# Patient Record
Sex: Male | Born: 1945 | Race: White | Hispanic: No | Marital: Married | Smoking: Never smoker
Health system: Southern US, Community
[De-identification: ages and names within clinical notes are randomized; demographics above are authoritative.]

## PROBLEM LIST (undated history)

## (undated) DIAGNOSIS — M109 Gout, unspecified: Secondary | ICD-10-CM

## (undated) DIAGNOSIS — E079 Disorder of thyroid, unspecified: Secondary | ICD-10-CM

## (undated) DIAGNOSIS — I10 Essential (primary) hypertension: Secondary | ICD-10-CM

## (undated) DIAGNOSIS — E785 Hyperlipidemia, unspecified: Secondary | ICD-10-CM

## (undated) HISTORY — PX: TONSILLECTOMY: SUR1361

## (undated) HISTORY — PX: CARDIAC SURGERY: SHX584

## (undated) HISTORY — DX: Disorder of thyroid, unspecified: E07.9

## (undated) HISTORY — PX: OTHER SURGICAL HISTORY: SHX169

## (undated) HISTORY — DX: Essential (primary) hypertension: I10

## (undated) HISTORY — DX: Hyperlipidemia, unspecified: E78.5

## (undated) HISTORY — DX: Gout, unspecified: M10.9

## (undated) HISTORY — PX: APPENDECTOMY: SHX54

---

## 2007-02-20 LAB — HM COLONOSCOPY

## 2012-01-25 ENCOUNTER — Inpatient Hospital Stay: Payer: Self-pay | Admitting: Family Medicine

## 2012-01-25 LAB — COMPREHENSIVE METABOLIC PANEL
Albumin: 4.3 g/dL (ref 3.4–5.0)
Alkaline Phosphatase: 81 U/L (ref 50–136)
BUN: 25 mg/dL — ABNORMAL HIGH (ref 7–18)
Bilirubin,Total: 1.3 mg/dL — ABNORMAL HIGH (ref 0.2–1.0)
Calcium, Total: 8.7 mg/dL (ref 8.5–10.1)
Co2: 19 mmol/L — ABNORMAL LOW (ref 21–32)
Creatinine: 2.15 mg/dL — ABNORMAL HIGH (ref 0.60–1.30)
EGFR (African American): 35 — ABNORMAL LOW
Glucose: 119 mg/dL — ABNORMAL HIGH (ref 65–99)
Osmolality: 277 (ref 275–301)
Sodium: 136 mmol/L (ref 136–145)
Total Protein: 7.7 g/dL (ref 6.4–8.2)

## 2012-01-25 LAB — URINALYSIS, COMPLETE
Bilirubin,UR: NEGATIVE
Blood: NEGATIVE
Glucose,UR: NEGATIVE mg/dL (ref 0–75)
Hyaline Cast: 3
Ketone: NEGATIVE
Leukocyte Esterase: NEGATIVE
Nitrite: NEGATIVE
Ph: 6 (ref 4.5–8.0)
Specific Gravity: 1.008 (ref 1.003–1.030)
WBC UR: 1 /HPF (ref 0–5)

## 2012-01-25 LAB — MAGNESIUM: Magnesium: 1.6 mg/dL — ABNORMAL LOW

## 2012-01-25 LAB — CBC WITH DIFFERENTIAL/PLATELET
Eosinophil %: 2.1 %
HCT: 32.7 % — ABNORMAL LOW (ref 40.0–52.0)
Lymphocyte #: 1.7 10*3/uL (ref 1.0–3.6)
MCH: 20 pg — ABNORMAL LOW (ref 26.0–34.0)
MCV: 65 fL — ABNORMAL LOW (ref 80–100)
Monocyte #: 1.2 x10 3/mm — ABNORMAL HIGH (ref 0.2–1.0)
Monocyte %: 11.4 %
Neutrophil #: 7.4 10*3/uL — ABNORMAL HIGH (ref 1.4–6.5)
Platelet: 379 10*3/uL (ref 150–440)
RBC: 5.04 10*6/uL (ref 4.40–5.90)
WBC: 10.7 10*3/uL — ABNORMAL HIGH (ref 3.8–10.6)

## 2012-01-25 LAB — CK TOTAL AND CKMB (NOT AT ARMC): CK, Total: 180 U/L (ref 35–232)

## 2012-01-25 LAB — IRON AND TIBC
Iron Bind.Cap.(Total): 503 ug/dL — ABNORMAL HIGH (ref 250–450)
Iron Saturation: 7 %
Iron: 33 ug/dL — ABNORMAL LOW (ref 65–175)
Unbound Iron-Bind.Cap.: 470 ug/dL

## 2012-01-25 LAB — PROTIME-INR
INR: 0.9
Prothrombin Time: 12.8 secs (ref 11.5–14.7)

## 2012-01-25 LAB — PRO B NATRIURETIC PEPTIDE: B-Type Natriuretic Peptide: 1001 pg/mL — ABNORMAL HIGH (ref 0–125)

## 2012-01-25 LAB — TSH: Thyroid Stimulating Horm: 7.63 u[IU]/mL — ABNORMAL HIGH

## 2012-01-25 LAB — T4, FREE: Free Thyroxine: 1.17 ng/dL (ref 0.76–1.46)

## 2012-01-26 LAB — LIPID PANEL
Cholesterol: 85 mg/dL (ref 0–200)
HDL Cholesterol: 21 mg/dL — ABNORMAL LOW (ref 40–60)
Ldl Cholesterol, Calc: 36 mg/dL (ref 0–100)
VLDL Cholesterol, Calc: 28 mg/dL (ref 5–40)

## 2012-01-26 LAB — TROPONIN I: Troponin-I: 0.66 ng/mL — ABNORMAL HIGH

## 2012-01-26 LAB — BASIC METABOLIC PANEL
BUN: 25 mg/dL — ABNORMAL HIGH (ref 7–18)
Chloride: 108 mmol/L — ABNORMAL HIGH (ref 98–107)
Co2: 23 mmol/L (ref 21–32)
Creatinine: 1.84 mg/dL — ABNORMAL HIGH (ref 0.60–1.30)
Sodium: 138 mmol/L (ref 136–145)

## 2012-01-26 LAB — CBC WITH DIFFERENTIAL/PLATELET
Basophil #: 0 x10 3/mm 3
Basophil %: 0.7 %
Eosinophil #: 0.3 x10 3/mm 3
Eosinophil %: 5 %
HCT: 27.3 % — ABNORMAL LOW
HGB: 8.4 g/dL — ABNORMAL LOW
Lymphocyte %: 18.3 %
Lymphs Abs: 1.1 x10 3/mm 3
MCH: 20.4 pg — ABNORMAL LOW
MCHC: 30.8 g/dL — ABNORMAL LOW
MCV: 66 fL — ABNORMAL LOW
Monocyte #: 0.8 "x10 3/mm "
Monocyte %: 12.6 %
Neutrophil #: 3.9 x10 3/mm 3
Neutrophil %: 63.4 %
Platelet: 238 x10 3/mm 3
RBC: 4.13 x10 6/mm 3 — ABNORMAL LOW
RDW: 19.2 % — ABNORMAL HIGH
WBC: 6.2 x10 3/mm 3

## 2012-01-26 LAB — MAGNESIUM: Magnesium: 2 mg/dL

## 2013-01-28 ENCOUNTER — Ambulatory Visit: Payer: Self-pay | Admitting: General Practice

## 2013-01-28 LAB — URINALYSIS, COMPLETE
Blood: NEGATIVE
Glucose,UR: NEGATIVE mg/dL (ref 0–75)
Hyaline Cast: 2
Nitrite: NEGATIVE
Ph: 5 (ref 4.5–8.0)
Protein: 100
Squamous Epithelial: <1

## 2013-01-28 LAB — CBC
MCH: 30.1 pg (ref 26.0–34.0)
MCHC: 33.5 g/dL (ref 32.0–36.0)
RBC: 5.57 10*6/uL (ref 4.40–5.90)
RDW: 13.5 % (ref 11.5–14.5)

## 2013-01-28 LAB — BASIC METABOLIC PANEL
Anion Gap: 7 (ref 7–16)
Calcium, Total: 9.5 mg/dL (ref 8.5–10.1)
EGFR (African American): 53 — ABNORMAL LOW
Glucose: 104 mg/dL — ABNORMAL HIGH (ref 65–99)
Osmolality: 281 (ref 275–301)
Sodium: 138 mmol/L (ref 136–145)

## 2013-01-28 LAB — PROTIME-INR: INR: 0.9

## 2013-01-29 LAB — URINE CULTURE

## 2013-02-09 ENCOUNTER — Inpatient Hospital Stay: Payer: Self-pay | Admitting: General Practice

## 2013-02-10 LAB — BASIC METABOLIC PANEL
Anion Gap: 5 — ABNORMAL LOW (ref 7–16)
BUN: 23 mg/dL — ABNORMAL HIGH (ref 7–18)
Calcium, Total: 8.3 mg/dL — ABNORMAL LOW (ref 8.5–10.1)
Chloride: 100 mmol/L (ref 98–107)
Co2: 27 mmol/L (ref 21–32)
Creatinine: 1.81 mg/dL — ABNORMAL HIGH (ref 0.60–1.30)
Potassium: 4.3 mmol/L (ref 3.5–5.1)
Sodium: 132 mmol/L — ABNORMAL LOW (ref 136–145)

## 2013-02-10 LAB — HEMOGLOBIN: HGB: 13.1 g/dL (ref 13.0–18.0)

## 2013-02-10 LAB — PLATELET COUNT: Platelet: 149 10*3/uL — ABNORMAL LOW (ref 150–440)

## 2013-02-11 LAB — BASIC METABOLIC PANEL
Calcium, Total: 8.3 mg/dL — ABNORMAL LOW (ref 8.5–10.1)
Chloride: 98 mmol/L (ref 98–107)
Co2: 26 mmol/L (ref 21–32)
EGFR (African American): 46 — ABNORMAL LOW
Glucose: 111 mg/dL — ABNORMAL HIGH (ref 65–99)
Osmolality: 267 (ref 275–301)
Potassium: 4 mmol/L (ref 3.5–5.1)
Sodium: 131 mmol/L — ABNORMAL LOW (ref 136–145)

## 2013-02-11 LAB — PLATELET COUNT: Platelet: 139 10*3/uL — ABNORMAL LOW (ref 150–440)

## 2013-02-20 ENCOUNTER — Observation Stay: Payer: Self-pay | Admitting: Internal Medicine

## 2013-02-20 LAB — COMPREHENSIVE METABOLIC PANEL
ALK PHOS: 83 U/L
ANION GAP: 12 (ref 7–16)
Albumin: 3.3 g/dL — ABNORMAL LOW (ref 3.4–5.0)
BUN: 37 mg/dL — ABNORMAL HIGH (ref 7–18)
Bilirubin,Total: 2 mg/dL — ABNORMAL HIGH (ref 0.2–1.0)
CALCIUM: 9.4 mg/dL (ref 8.5–10.1)
Chloride: 99 mmol/L (ref 98–107)
Co2: 21 mmol/L (ref 21–32)
Creatinine: 2.42 mg/dL — ABNORMAL HIGH (ref 0.60–1.30)
EGFR (African American): 30 — ABNORMAL LOW
GFR CALC NON AF AMER: 26 — AB
GLUCOSE: 119 mg/dL — AB (ref 65–99)
Osmolality: 274 (ref 275–301)
Potassium: 3.9 mmol/L (ref 3.5–5.1)
SGOT(AST): 28 U/L (ref 15–37)
SGPT (ALT): 30 U/L (ref 12–78)
SODIUM: 132 mmol/L — AB (ref 136–145)
Total Protein: 7.3 g/dL (ref 6.4–8.2)

## 2013-02-20 LAB — CBC
HCT: 33 % — ABNORMAL LOW (ref 40.0–52.0)
HGB: 11.1 g/dL — ABNORMAL LOW (ref 13.0–18.0)
MCH: 30.2 pg (ref 26.0–34.0)
MCHC: 33.7 g/dL (ref 32.0–36.0)
MCV: 90 fL (ref 80–100)
Platelet: 559 10*3/uL — ABNORMAL HIGH (ref 150–440)
RBC: 3.67 10*6/uL — ABNORMAL LOW (ref 4.40–5.90)
RDW: 13.6 % (ref 11.5–14.5)
WBC: 16.6 10*3/uL — AB (ref 3.8–10.6)

## 2013-02-20 LAB — TROPONIN I
TROPONIN-I: 0.06 ng/mL — AB
Troponin-I: 0.03 ng/mL
Troponin-I: 0.08 ng/mL — ABNORMAL HIGH

## 2013-02-20 LAB — URINALYSIS, COMPLETE
Bacteria: NONE SEEN
Bilirubin,UR: NEGATIVE
Blood: NEGATIVE
Glucose,UR: NEGATIVE mg/dL (ref 0–75)
Hyaline Cast: 6
KETONE: NEGATIVE
Leukocyte Esterase: NEGATIVE
Nitrite: NEGATIVE
Ph: 6 (ref 4.5–8.0)
SPECIFIC GRAVITY: 1.014 (ref 1.003–1.030)
Squamous Epithelial: <1

## 2013-02-20 LAB — TSH: THYROID STIMULATING HORM: 5.82 u[IU]/mL — AB

## 2013-02-21 LAB — CBC WITH DIFFERENTIAL/PLATELET
BASOS ABS: 0.1 10*3/uL (ref 0.0–0.1)
BASOS PCT: 0.9 %
EOS ABS: 0.3 10*3/uL (ref 0.0–0.7)
Eosinophil %: 3.1 %
HCT: 26.9 % — ABNORMAL LOW (ref 40.0–52.0)
HGB: 9.1 g/dL — ABNORMAL LOW (ref 13.0–18.0)
LYMPHS PCT: 12.6 %
Lymphocyte #: 1.1 10*3/uL (ref 1.0–3.6)
MCH: 30.6 pg (ref 26.0–34.0)
MCHC: 33.7 g/dL (ref 32.0–36.0)
MCV: 91 fL (ref 80–100)
MONOS PCT: 9.4 %
Monocyte #: 0.8 x10 3/mm (ref 0.2–1.0)
Neutrophil #: 6.2 10*3/uL (ref 1.4–6.5)
Neutrophil %: 74 %
Platelet: 365 10*3/uL (ref 150–440)
RBC: 2.97 10*6/uL — ABNORMAL LOW (ref 4.40–5.90)
RDW: 13.8 % (ref 11.5–14.5)
WBC: 8.3 10*3/uL (ref 3.8–10.6)

## 2013-02-21 LAB — BASIC METABOLIC PANEL
ANION GAP: 7 (ref 7–16)
BUN: 40 mg/dL — ABNORMAL HIGH (ref 7–18)
CALCIUM: 8.4 mg/dL — AB (ref 8.5–10.1)
CHLORIDE: 103 mmol/L (ref 98–107)
CO2: 21 mmol/L (ref 21–32)
Creatinine: 2.12 mg/dL — ABNORMAL HIGH (ref 0.60–1.30)
GFR CALC AF AMER: 36 — AB
GFR CALC NON AF AMER: 31 — AB
Glucose: 123 mg/dL — ABNORMAL HIGH (ref 65–99)
Osmolality: 274 (ref 275–301)
POTASSIUM: 3.8 mmol/L (ref 3.5–5.1)
Sodium: 131 mmol/L — ABNORMAL LOW (ref 136–145)

## 2013-02-21 LAB — LIPID PANEL
Cholesterol: 107 mg/dL (ref 0–200)
HDL: 22 mg/dL — AB (ref 40–60)
LDL CHOLESTEROL, CALC: 57 mg/dL (ref 0–100)
Triglycerides: 138 mg/dL (ref 0–200)
VLDL Cholesterol, Calc: 28 mg/dL (ref 5–40)

## 2013-02-21 LAB — TSH: THYROID STIMULATING HORM: 2.87 u[IU]/mL

## 2013-07-22 LAB — TSH: TSH: 4.01 u[IU]/mL (ref <=5.90)

## 2014-05-20 DIAGNOSIS — I10 Essential (primary) hypertension: Secondary | ICD-10-CM | POA: Insufficient documentation

## 2014-06-08 NOTE — H&P (Signed)
PATIENT NAME:  [name], [name] MR#:  [id number] DATE OF BIRTH:  [birth year]  DATE OF ADMISSION:  01/25/2012  PRIMARY CARDIOLOGIST: Dr. Gwen Pounds.   PRIMARY CARE PHYSICIAN: Dr. Elizabeth Sauer.   REFERRING PHYSICIAN: Dr. Jene Every.   CHIEF COMPLAINT: Lightheadedness and low blood pressure.   ADMITTING DIAGNOSES: Supraventricular tachycardia/wide complex tachycardia.  HISTORY OF PRESENT ILLNESS: Mr. Kehoe is a very nice 69 year old gentleman who has history of coronary artery disease, hypertension, hyperlipidemia. He is status post coronary artery bypass graft in 1999. Since then he has not had any problems and he has been overall asymptomatic. He denies any angina or any other cardiovascular problems for years. This morning the patient states that he woke up, got up without any problems, feeling normal, went to the shower, came out and after he got dressed, went to the computer, sat down and started having a feeling of significant lightheadedness. The feeling lasted for quite a while. He felt like he could not really get up and walk for what he sat down and waited until the lightheadedness passed. It never went away, but it got better for which he went into the room and checked his blood pressure with his blood pressure monitor. At that moment his blood pressure was 91/50. He got concerned about his blood pressure being so low because it has never been that way. He called a friend of his who told him to get checked by a doctor and he remembered that one of his neighbors was an EMT who came to the house to check him up and at that moment his blood pressure was even lower whenever he stood up, 70s/40s with a pulse of 160 for what the patient called his daughter and she brought him here to the ER. In the ER he was found to have low blood pressures in the 60s over 40s with a heart rate up to 178. The patient was immediately cardioverted and transform into bigeminy and then into normal sinus rhythm. He persists  to have occasional PVCs, but right now his blood pressure has improved significantly and he is in normal sinus rhythm. The patient states that overall he never had chest pain, never had shortness of breath, never had a headache during this episode. He just felt really weak and lightheaded. Dr. Lady Gary was consulted over the phone and he decided to keep him on amiodarone drip. The patient is going to be admitted to the Critical Care Unit for the drip. At this moment he is asymptomatic.   REVIEW OF SYSTEMS: The patient denies any fever. Denies any significant fatigue, weakness, weight loss or weight gain. He denies any problems with his eyes like blurry vision, changes in his eyesight or inflammation. ENT: Denies any tinnitus, any seasonal allergies. No postnasal drip or difficulty swallowing. RESPIRATORY: No cough. No wheezing, no hemoptysis. No symptoms of viral respiratory illness. CARDIOVASCULAR: No chest pain or orthopnea. No edema. No previous arrhythmia, no palpitations, no syncopal episodes. GASTROINTESTINAL: No nausea or vomiting. No hematemesis. No rectal bleeding. He does have history of peptic ulcer disease for what he had a removal of three-quarters of his stomach about 40 years ago. GENITOURINARY: No dysuria or hematuria. No changes in frequency. No prostatitis. ENDOCRINE: No polyuria, polydipsia, or polyphagia. No cold or heat intolerance. HEME/LYMPH: No bleeding. No easy bruising. No blood thinners. MUSCULOSKELETAL: No significant gout or edema of joints. 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 NEUROLOGIC: No numbness or tingling. No ataxia. No cerebrovascular accidents. No  transient ischemic attacks. PSYCHIATRIC: No significant anxiety or insomnia. No depression.   PAST MEDICAL HISTORY:  1. Coronary artery disease, status post coronary artery bypass graft.  2. Hypertension.  3. Osteoarthritis.  4. Hyperlipidemia.   ALLERGIES: No known drug allergies.   PAST SURGICAL HISTORY:   1. Coronary artery bypass graft in 1999 four vessels.  2. Stomach resection due to peptic ulcer disease three-quarters of his stomach.  3. Appendectomy as a child.   FAMILY HISTORY: Mother had significant peptic ulcer disease. His father had coronary artery disease with coronary artery bypass graft. Denies cancer or diabetes in the family.   SOCIAL HISTORY: He has never smoked, but he chews tobacco. He chews about 1 pack a day. He denies any current alcohol use. 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   MEDICATIONS:  1. Metoprolol 50 mg once daily. 2. Lovastatin 20 mg once daily.  3. Celebrex 200 mg once daily. 4. Benicar/HCTZ 40 mg/25 mg once daily.  5. Aspirin enteric-coated 81 mg daily.   PHYSICAL EXAMINATION:  VITAL SIGNS: Blood pressure 108/55 now, pulse 81 now, highest pulse was 178 today. Lowest blood pressure of 69/44, prior cardioversion. Oxygen saturation 94% on room air, respiratory rate around 20 per minute.   GENERAL: The patient is alert, oriented x3. No acute distress. No respiratory distress. Hemodynamically stable.  HEENT: His pupils are equal and reactive. Extraocular movements are intact. Mucosa moist. No oral lesions. No oropharyngeal exudates. Anicteric sclerae. Pink conjunctivae.   NECK: Supple. No JVD. No thyromegaly. No adenopathy. No carotid bruits. Normal range of motion.   CARDIOVASCULAR: Right now regular rate and rhythm but with occasional PVCs. No murmurs, rubs, or gallops. No displacement of PMI. No tenderness to palpation of the chest wall.   LUNGS: Clear without any wheezing or crepitus. No use of accessory muscles.   ABDOMEN: Soft, nontender, nondistended. No hepatosplenomegaly. No masses. Bowel sounds are positive. There is a midline incision from above the umbilicus down to the chest from his previous coronary artery bypass graft and that is old and ealed.   GENITAL: Deferred.   EXTREMITIES: No edema, no cyanosis, no  clubbing. Pulses +2. Capillary refill is less than three seconds. Homans are negative in both extremities.   NEUROLOGIC: Cranial nerves II through XII are intact. Strength is five out of five in four extremities.   SKIN: Without any significant rashes. No petechiae.   PSYCH: Mood is normal without any depression or severe anxiety.   LYMPHATICS: Negative for lymphadenopathy on neck or supraclavicular area.   MUSCULOSKELETAL: No significant joint deformity or joint effusions.   LABORATORY, DIAGNOSTIC AND RADIOLOGICAL DATA: EKG: As mentioned above. BNP is 1,000. Glucose 118, BUN 25, creatinine 2.15, sodium 136, potassium 3.7, bilirubin 1.3. Other LFTs within normal limits. Troponin is negative x1. Normal CK. Normal CK-MB. White blood count 10,000, hemoglobin 10, hematocrit 32. The patient has microcytic, hypochromic anemia. Chest x-ray without any major acute abnormalities.   ASSESSMENT AND PLAN: 69 year old gentleman with history of coronary artery disease, hypertension, osteoarthritis, hyperlipidemia, who developed new onset supraventricular tachycardia today. This patient has a right bundle branch block. I do not have previous EKGs to compare. This has been called wide complex tachycardia and the patient is going to be on amiodarone.  1. Supraventricular tachycardia/wide complex tachycardia. The patient was admitted. His blood pressure was in the 60s over 40s. He was cardioverted x1 with resolution of the supraventricular tachycardia. The patient transformed into normal  sinus rhythm with bigeminy and then normal sinus rhythm with occasional PVCs. The patient is being kept on amiodarone drip. He is going to be transferred to the Critical Care Unit. The case has been discussed with Dr. Lady Gary by the ER physician and he wanted to stay on the amiodarone drip. I will touch bases with Dr. Lady Gary as well to figure out the end point of the plan.  2. As far as etiology of this, could be related to his coronary  artery disease, acute coronary syndrome. At this moment, his cardiac enzymes are negative for what we are going to recycle him. I am going to keep him on aspirin and also put him on just prophylactic dose of Lovenox, not therapeutic. Another etiology is to consider infection. The patient does not exhibit any signs of infection at this moment except for very slight increase of the white blood count. We are going to order a urinalysis. His chest x-ray did not show any signs of pneumonia. The patient has not had any fever or any other symptomatology.  3. Electrolytes. At this moment his potassium is fine. I am checking his magnesium stat and see if he requires any replacement.  4. Other possibility is  PE. The patient had a trip from [place] within less than a week. He states that his larger leg of the trip was two hours and a half, almost three hours which put him on low risk, although we are going to get a D-Dimer to make sure this diagnosis is not the cause of the arrhythmia, he has not had any significant hypoxemia, chest pain or shortness of breath. Cannot rule out the possibility of malignancy. The patient also has anemia and microcytic and is microcytic and hypochromic likely due to iron deficiency. He states that he had a colonoscopy over 10 years ago and was normal. I think the patient will require to have another colonoscopy soon to evaluate this new finding of anemia.  5. Acute kidney injury. The patient has a creatinine of 2.15 with slight elevation of the BUN of 25, likely due to ATN created from the significant hypotension that the patient had. We are going to keep him on IV fluids and follow up his creatinine. The patient is taking an ARB and a Cox-1 inhibitor for what I am going to hold on those two medications and avoid any other nephrotoxins.  6. We are going to use p.r.n. medications for his blood pressure in case it gets elevated but at this moment his blood pressure stays in the 100s/50s. We  are going to watch for that.  7. Dyslipidemia. Recheck lipids.  8. Deep vein thrombosis prophylaxis. Lovenox on renal dose. 9. Gastrointestinal prophylaxis. PPI.   The patient is a FULL CODE.   TIME SPENT: I spent about 50 minutes with this admission.   ____________________________ Felipa Furnace, MD rsg:ap D: 01/25/2012 15:58:25 ET T: 01/25/2012 16:19:06 ET JOB#: [id number]  cc: Felipa Furnace, MD, <Dictator> Duanne Limerick, MD Pearletha Furl MD ELECTRONICALLY SIGNED 01/29/2012 7:26

## 2014-06-08 NOTE — Consult Note (Signed)
Brief Consult Note: Diagnosis: dizziness with narrow complex tachycardia.   Patient was seen by consultant.   Recommend further assessment or treatment.   Orders entered.   Comments: Pt with history of cad s/p cabg in 1999 with a lima to lad, svg to om1 and rima to rca who was admitted after [resenting to his primary cardiologists office today and was noted to have irregular heart rate and was sent to the er. In the er, he was noted to be hypotensive and tachycardic. He was cardioverted to sinus rhtyhm and placed on amiodarone drip. He has ruled out for an mi thus far. He was in his usual state of health until he felt dizzy and presented for evaluation. He denies chest pain both earlier ad at present. His current ekg is nsr with no ischemia. Mg is 1.6. Would continue amiodarone for now and rule out for mi. Echo to evaluate lv and atrial size and valves. Will follow with you. FUll note to follow.  Electronic Signatures: Dalia HeadingFath, Azaylia Fong A (MD)  (Signed 06-Dec-13 16:39)  Authored: Brief Consult Note   Last Updated: 06-Dec-13 16:39 by Dalia HeadingFath, Rosaleigh Brazzel A (MD)

## 2014-06-08 NOTE — Discharge Summary (Signed)
PATIENT NAME:  [name], [name] MR#:  [id number] DATE OF BIRTH:  [birth year]  DATE OF ADMISSION:  01/25/2012 DATE OF DISCHARGE:  01/26/2012  REASON FOR ADMISSION: Severe supraventricular tachycardia with hypotension.   FINAL DIAGNOSES:  1. Supraventricular tachycardia. 2. Atrial fibrillation with rapid ventricular response.  3. Hypertension.  4. Coronary artery disease status post artery bypass graft.  5. Osteoporosis.  6. Hyperlipidemia.  7. Hypomagnesemia, corrected.  8. Acute kidney injury on top of chronic kidney disease.  9. Dyslipidemia.   DISPOSITION: Home.   MEDICATIONS AT DISCHARGE:  1. Lovastatin 20 mg once a day. 2. Benicar HCTZ 40 mg/25 mg once a day. 3. Metoprolol succinate 50 mg once a day. 4. Aspirin enteric coated 81 mg once a day. 5. Amiodarone 200 mg tablet two once a day until seen by Dr. Gwen Reed. 6. Hold Celebrex due to acute kidney injury.   FOLLOW UP: Dr. Harold HedgeKenneth Reed or Dr. Gwen Reed within a week; [name] within 1 to 2 weeks.   LABORATORY, DIAGNOSTIC AND RADIOLOGICAL DATA: BNP 1000, glucose 119, iron level 33. Creatinine 2.15, at discharge 1.84. Iron binding capacity elevated at 503, magnesium 1.6, ferritin 5, triglycerides 139, LDL 36. LFTs within normal limits with mild elevation of bilirubin to 1.3. Troponin 0.66 at discharge after cardioversion. TSH 7.63. Free thyroxine 1.17, which is within normal limits. White count 10.7 at admission, 6.2 at discharge. Hemoglobin 10.1 on admission, 8.4 at discharge platelets. 238, hypochromic microcytic anemia. Urinalysis within normal limits. T3 free 101, which is normal as well.   HOSPITAL COURSE: Mr. [name] is a very nice 69 year old gentleman with history of coronary artery disease, hypertension, hyperlipidemia who had coronary artery bypass graft in 1999. He hasn'Reed had any problems since then but the morning of admission he woke up being his normal self without any problems, went to shower when he sat down into  the chair to do some work on the computer he felt significantly lightheaded. When he checked his blood pressure his blood pressure was decreased in the 90s/50s for what he called his friend, who is an EMT, who checked his blood pressure was 70s/40's with heart rate in the 170s for what the patient was sent to the ER. At the ER the patient was evaluated and due to his significant hypotension he was cardioverted. After his cardioversion he transformed into atrial fibrillation then into normal sinus rhythm with bigeminy and then normal sinus rhythm with some PVCs for what the patient was kept on IV amiodarone. During the night he tolerated his amiodarone very well and his heart rate remained in the 60s, normal sinus rhythm. The highest it went was to the 80s. Patient insisted that he needed to be discharged. I insisted on keeping the patient another day but after talking with the cardiologist we decided that if he is going to leave AGAINST MEDICAL ADVICE we would probably better discharge him with his prescriptions and in good faith with his medications and follow up. As far as his atrial fibrillation, his CHADS score is only 1 for what he is going to be taking aspirin only. He is going to be discharged on 400 mg of amiodarone and this is going to be regulated by Dr. Gwen Reed.   Anemia. Patient has significant microcytic, hypochromic anemia related to iron deficiency. His higher levels were very low. He needs to have a follow-up colonoscopy, follow up with his primary care physician for iron prescription. At this moment he is not going to be taking  an iron prescription until he sees his PCP again and gets scheduled for a colonoscopy. There were no signs of deep vein thrombosis. His d-dimer was negative for what we didn'Reed order a PE. He also had acute kidney injury on top of chronic kidney disease for what I held his ACE inhibitor and his ARB. He is okay to restart his ARB now. His blood pressure was very low for what  his blood pressure medications were held but he is good to start them again. His dyslipidemia he is taking a statin and his LDL is very low.   Other than that the patient did well. His magnesium was repleted. He has a new problem, which is elevated TSH but normal T4 and T3. This could be just related to stress or could be just subclinical hypothyroidism for what he needs to follow up with his primary care physician as well. He promised me to follow up for his anemia and his thyroid.     TIME SPENT: I spent 80 minutes with this patient in this discharge today.   ____________________________ Cory Furnace, MD rsg:cms D: 01/26/2012 15:41:15 ET Reed: 01/27/2012 11:39:36 ET JOB#: [id number]  cc: Cory Furnace, MD, <Dictator> Duanne Limerick, MD Pearletha Furl MD ELECTRONICALLY SIGNED 02/03/2012 14:05

## 2014-06-08 NOTE — Consult Note (Signed)
General Aspect 69 yo male with history of cad s/p cabg in 2000 with a lima to the lad, svg to om1 and rima to rca, hyhpertension and hyperlipidemia who was admitted after dveloping sudden onset of eakness, dizzziness and near syncoope/ He presented ot the er where he was noted to have a rapid narrow complex svt with rbbb. He was hypotensive and converted to nsr by DC shock in the er per er physician. He was placed on iv amiodarone and admitted to ccu. He remained in nsr on amiodarone drip. He ruled out for an mi. He had not had any symptoms similar to this in the past. Echo revealed preserved lv funciton.   Physical Exam:   GEN no acute distress, obese    HEENT PERRL, hearing intact to voice    NECK supple  No masses    RESP normal resp effort  clear BS    CARD Regular rate and rhythm  Normal, S1, S2  Murmur    Murmur Systolic    Systolic Murmur axilla    ABD denies tenderness  normal BS    LYMPH negative neck    EXTR negative cyanosis/clubbing, negative edema    SKIN normal to palpation    NEURO cranial nerves intact, motor/sensory function intact    PSYCH A+O to time, place, person   Review of Systems:   Subjective/Chief Complaint dizziness with rapid heart rate    General: Fatigue  Weakness    Skin: No Complaints    ENT: No Complaints    Eyes: No Complaints    Neck: No Complaints    Respiratory: No Complaints    Cardiovascular: Palpitations    Gastrointestinal: No Complaints    Genitourinary: No Complaints    Vascular: No Complaints    Neurologic: Dizzness    Hematologic: No Complaints    Endocrine: No Complaints    Psychiatric: No Complaints    Review of Systems: All other systems were reviewed and found to be negative    Medications/Allergies Reviewed Medications/Allergies reviewed   Home Medications: Medication Instructions Status  amiodarone 200 mg oral tablet 2 tab(s) orally once a day Active  lovastatin 20 mg oral tablet 1 tab(s) orally  once a day (at bedtime) Active  Benicar HCT 40 mg-25 mg oral tablet 1 tab(s) orally once a day (in the morning). Active  metoprolol succinate 50 mg oral tablet, extended release 1 tab(s) orally once a day (in the morning). Active  Aspirin Enteric Coated 81 mg oral delayed release tablet 1 tab(s) orally once a day (at bedtime). Active   EKG:   EKG NSR    Abnormal NSSTTW changes  RBBB    Interpretation narroow complex svt convert to sinus rhythm with cardioversion    No Known Allergies:     Impression Pt with history of cad s/p cabg admittted with hypotension and svt which began suddenly. He was converted to nsr in the er with dc shock and has remained in nsr after being placed on amiodarone drip. His chadss score is 1 for hypertension. Etiology of event is unclear. Pt has been loaded with amiodarone from the er. He may not require this long term but would convert to po  amiodaorne and transfer to telemetry. If he remains in nsr, would dischare on 400 mg dialy and consider converting to 200 mg daily as outpatient. Would defer antiocagulation at present given low chadss score and use asa alone.    Plan 1. D/C amioadonre frip and convert  to amiodarone 400 mg po daily 2. ASA daily 3Defer anticoagulation at present given low chadss score. 4. Ambulate and consider discharge if stable in am 5. Follow up with Dr. Gwen PoundsKowalski as outpatinet.   Electronic Signatures: Dalia HeadingFath, Kenneth A (MD)  (Signed 08-Dec-13 15:26)  Authored: General Aspect/Present Illness, History and Physical Exam, Review of System, Home Medications, EKG , Allergies, Impression/Plan   Last Updated: 08-Dec-13 15:26 by Dalia HeadingFath, Kenneth A (MD)

## 2014-06-08 NOTE — H&P (Signed)
PATIENT NAME:  [name], [name] MR#:  [id number] DATE OF BIRTH:  1945/03/13  DATE OF ADMISSION:  01/25/2012  ADDENDUM  ASSESSMENT AND PLAN:    1. Tobacco abuse. The patient chews about one pack a day of tobacco. I gave him some education about risk of tobacco despite the fact he does not smoke tobacco, it still has implications on his cardiovascular problems. The patient understands, but he is not ready to quit. I gave him five minutes education about it.  2. Critical care. The patient has significant problems that are in risk of decompensation.   CRITICAL CARE TIME: 50 minutes.    ____________________________ Felipa Furnaceoberto Sanchez Gutierrez, MD rsg:ap D: 01/25/2012 16:00:59 ET Reed: 01/25/2012 16:16:18 ET JOB#: [id number]  cc: Felipa Furnaceoberto Sanchez Gutierrez, MD, <Dictator>  Allea Kassner Juanda ChanceSANCHEZ GUTIERRE MD ELECTRONICALLY SIGNED 01/29/2012 7:25

## 2014-06-11 NOTE — Op Note (Signed)
PATIENT NAME:  [name], [name] MR#:  [id number] DATE OF BIRTH:  1945/05/13  DATE OF PROCEDURE:  02/09/2013  PREOPERATIVE DIAGNOSIS: Degenerative arthrosis of the left knee.   POSTOPERATIVE DIAGNOSIS: Degenerative arthrosis of the left knee.   PROCEDURE PERFORMED: Left total knee arthroplasty using computer-assisted navigation.   SURGEON: Illene Labrador. Ernest Pine, MD  ASSISTANT: April Berndt, NP (required to maintain retraction throughout the procedure).   ANESTHESIA: Spinal.   ESTIMATED BLOOD LOSS: 100 mL.   FLUIDS REPLACED: 1800 mL of crystalloid.   TOURNIQUET TIME: 116 minutes.   DRAINS: Two medium drains to reinfusion system.   SOFT TISSUE RELEASES: Anterior cruciate ligament, posterior cruciate ligament, deep medial collateral ligament, patellofemoral ligament, and posterolateral corner.   IMPLANTS UTILIZED: DePuy PFC Sigma size 4 posterior stabilized femoral component (cemented), size 5 MBT tibial component (cemented), 38 mm three-peg oval dome patella (cemented), and a 15 mm stabilized rotating platform polyethylene insert.   Gentamicin cement was utilized.   INDICATIONS FOR SURGERY: The patient is a 69 year old gentleman who has been seen for complaints of progressive left knee pain and valgus deformity. Risks and benefits of surgical intervention were discussed in detail with the patient. The patient expressed understanding of the risks and benefits and agreed with plans for surgical intervention.   PROCEDURE IN DETAIL: The patient was brought to the operating room and, after adequate spinal anesthesia was achieved, a tourniquet was placed on the patient's upper left thigh. The patient's left knee and leg were cleaned and prepped with alcohol and DuraPrep and draped in the usual sterile fashion. A "timeout" was performed as per usual protocol. The left lower extremity was exsanguinated using an Esmarch, and the tourniquet was inflated to 300 mmHg.   An anterior longitudinal incision was  made followed by a standard mid vastus approach. A large effusion was evacuated. The deep fibers of the medial collateral ligament were elevated in a subperiosteal fashion off the medial flare of the tibia so as to maintain a continuous soft-tissue sleeve. The patella was subluxed laterally and the patellofemoral ligament was incised.   Inspection of the knee demonstrated severe degenerative changes in tricompartmental fashion with full-thickness loss of articular cartilage to the lateral compartment. Anterior and posterior cruciate ligaments were excised. Prominent osteophytes were debrided using a rongeur. Two 4.0 mm Schanz pins were inserted into the femur and into the tibia for attachment of the array of trackers used for computer-assisted navigation. Hip center was identified using circumduction technique. Distal landmarks were mapped using the computer. The distal femur and proximal tibia were mapped using the computer. Distal femoral cutting guide was positioned using computer-assisted navigation so as to achieve a 5-degree distal valgus cut. The cut was performed and verified using the computer. Distal femur was sized and it was felt that a size 4 femoral component was appropriate. A size 4 cutting guide was positioned and the anterior cut was performed and verified using the computer. This was followed by completion of the posterior and chamfer cuts. Femoral cutting guide for the central box was then positioned and the central box cut was performed.   Attention was then directed to the proximal tibia. Medial and lateral menisci were excised. The extramedullary tibial cutting guide was positioned using computer-assisted navigation so as to achieve a 0-degree varus-valgus alignment and 0-degree posterior slope. The proximal tibia was sized and it was felt that a size 5 tibial tray was appropriate. Tibial and femoral trials were inserted followed by insertion of a 10 and  subsequently a 12.5 mm  polyethylene. The knee was felt to be extremely tight laterally. Trial components were removed and the knee was brought into extension using Moreland retractors. Posterolateral corner was carefully released using a combination of electrocautery and Metzenbaum scissors. This allowed for excellent mediolateral soft-tissue balancing. Trial components were inserted followed by insertion of a 15 mm polyethylene insert. Excellent mediolateral soft-tissue balancing was appreciated both in full extension and in flexion.   Finally, the patella was cut and prepared so as to accommodate a 38 mm three-peg oval dome patella. Patellar trial was placed and the knee was placed through a range of motion. Excellent patellar tracking was appreciated. The femoral trial was removed after debridement of posterior osteophytes. Central post hole for the tibial component was reamed followed by insertion of a keel punch. Tibial trial was then removed. The cut surfaces of bone were irrigated with copious amounts of normal saline with antibiotic solution using pulsatile lavage and then suctioned dry.   Polymethyl methacrylate cement with gentamicin was prepared in the usual fashion using a vacuum mixer. Cement was applied to the cut surface of the proximal tibia as well as along the undersurface of size 5 MBT tibial component. The tibial component was positioned and impacted into place. Excess cement was removed using Personal assistantreer elevators. Cement was then applied to the cut surface of the femur as well as along the posterior flanges of a size 4 posterior stabilized femoral component. Femoral component was positioned and impacted into place. Excess cement was removed using Personal assistantreer elevators. A 15 mm polyethylene trial was inserted and the knee was brought in full extension with steady axial compression applied. Finally, cement was applied to the backside of a 38 mm three-peg oval dome patella and the patellar component was positioned and patellar  clamp applied. Excess cement was removed using Personal assistantreer elevators.   After adequate curing of cement, the tourniquet was deflated after a total tourniquet time of 116 minutes. Hemostasis was achieved using electrocautery. The knee was irrigated with copious amounts of normal saline with antibiotic solution using pulsatile lavage and then suctioned dry. The knee was inspected for any residual cement debris. Then 20 mL of 1.3% Exparel in 40 mL of normal saline was injected along the posterior capsule, medial and lateral gutters, and along the arthrotomy site. A 15 mm stabilized rotating platform polyethylene insert was inserted and the knee was placed through a range of motion. Excellent patellar tracking was appreciated and excellent mediolateral soft-tissue balance was appreciated.   Two medium drains were placed in the wound bed and brought out through a separate stab incision. The medial parapatellar portion of the incision was reapproximated using interrupted sutures of #1 Vicryl. The subcutaneous tissue was injected with 30 mL of 0.25% Marcaine with epinephrine. The subcutaneous tissue was then reapproximated in layers using first #0 Vicryl followed by #2-0 Vicryl. Skin was closed with skin staples. A sterile dressing was applied.   The patient tolerated the procedure well. He was transported to the recovery room in stable condition.    ____________________________ Illene LabradorJames P. Angie FavaHooten Jr., MD jph:np D: 02/09/2013 16:01:19 ET T: 02/09/2013 22:49:06 ET JOB#: [id number]  cc: Fayrene FearingJames P. Angie FavaHooten Jr., MD, <Dictator> JAMES P Angie FavaHOOTEN JR MD ELECTRONICALLY SIGNED 02/11/2013 7:25

## 2014-06-12 NOTE — Consult Note (Signed)
   Present Illness 69 yo male with history of cad s/p cabg, s/p recent left tkr per Dr. Marry Guan, history of svt who was admitted after presenting to our office with heart rate of 190. He was transferred to er where he was noted to have a heart rate of 190 with a rbbb. The ekg was similar to his last event that occurred approximately 1 year ago. He was relatively hypotensive with the event. He recieved iv andeocard with conversion to nsr with recurrent svt. He then recieved iv cardizem with conversion to nsr. He was placed on po cardizem. He had dizziness and chest heaviness with the event. He has ruled out for an mi.   Physical Exam:  GEN well developed, well nourished, no acute distress   HEENT PERRL, hearing intact to voice   NECK supple   RESP normal resp effort  clear BS   CARD Tachycardic  Normal, S1, S2   ABD denies tenderness  no hernia   LYMPH negative axillae   EXTR negative cyanosis/clubbing, negative edema   SKIN normal to palpation   NEURO cranial nerves intact, motor/sensory function intact   PSYCH A+O to time, place, person   Review of Systems:  Subjective/Chief Complaint rapid heart rate and dizziness   General: Fatigue  Weakness   Skin: No Complaints   ENT: No Complaints   Eyes: No Complaints   Neck: No Complaints   Respiratory: No Complaints   Cardiovascular: Tightness  Palpitations   Gastrointestinal: No Complaints   Genitourinary: No Complaints   Vascular: No Complaints   Musculoskeletal: left knee pain post tkr   Neurologic: No Complaints   Hematologic: No Complaints   Endocrine: No Complaints   Psychiatric: No Complaints   Review of Systems: All other systems were reviewed and found to be negative   EKG:  Interpretation svt converted to nxr    No Known Allergies:    Impression Pt with history of cad s/p cabg and recent tkr with previous history of svt now readmitted after an episode of sustained svt converted to nsr with  adenocard and cardizem. WOuld continue to treat with po cardizem and convert to long acting cardizem. FOllow on telemetry. ALos has ckd. Will hydrate and recheck met b   Plan 1. Cardizem 60 q 6 2. Follow heart rate 3. Hydarte and follow renal funciton 4. Further recs pending course.   Electronic Signatures: Teodoro Spray (MD)  (Signed 03-Jan-15 09:37)  Authored: General Aspect/Present Illness, History and Physical Exam, Review of System, EKG , Allergies, Impression/Plan   Last Updated: 03-Jan-15 09:37 by Teodoro Spray (MD)

## 2014-06-12 NOTE — Discharge Summary (Signed)
PATIENT NAME:  [name], [name] MR#:  [id number] DATE OF BIRTH:  Nov 04, 1945  DATE OF ADMISSION:  02/20/2013 DATE OF DISCHARGE:  02/21/2013  ADMITTING DIAGNOSIS: Dizzy spells.   DISCHARGE DIAGNOSES: 1.  Dizziness due to supraventricular tachycardia. Now heart rate is stable, status post Cardiology evaluation.  2.  Acute on chronic renal failure improved with IV hydration.  3.  Hypertension.  4.  Coronary artery disease.  5.  History of gout.  6.  Hypothyroidism.  7.  Leukocytosis felt to be reactive.  8.  History of arthritis.  9.  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  10.  Status post coronary artery bypass graft.  11.  Status post left total knee replacement.   CONSULTANTS: Dr. Lady GaryFath   PERTINENT LABS AND EVALUATIONS: EKG on admission showed wide QRS tachycardia. TSH slightly elevated at 5.82. Troponin 0.03. WBC 16.6, hemoglobin 11.1, platelet count was 559. His BUN was 37, creatinine 2.42. Chest x-ray showed no acute cardiopulmonary processes. Urinalysis was negative. Troponin was subsequently 0.06 and 0.08. Lipid panel: Total cholesterol 107, triglycerides 138, HDL 22, LDL was 57. BUN and creatinine  today, 40 and 2.12.   HOSPITAL COURSE: Please refer to H and P done by the admitting physician. The patient is a 69 year old white male with a previous history of SVT. He has a history of coronary artery disease, hypothyroidism, gout, chronic kidney disease, who presented with dizzy spells. The patient was noted to be in SVT. He had to be treated with IV adenosine and be placed on IV Cardizem. With these treatments, his heart rate got much better. He was seen by Cardiology. He  was started on p.o. Cardizem, along with the metoprolol that he was taking.  He had no further episodes of SVT during the hospitalization. He is doing much better. He has been cleared by Cardiology for discharge.   DISCHARGE MEDICATIONS: Lovastatin 20 mg at bedtime, metoprolol succinate 50  daily, iron sulfate 325 mg 1 tab p.o. daily, Synthroid 100 mcg daily, allopurinol 100 daily as needed tramadol 50 mg 1 tab p.o. b.i.d. as needed. Acetaminophen 2 tabs q. 4 p.r.n. for pain or fever, aspirin 81 mg 1 tab p.o. daily, diltiazem 240 one tab p.o. daily, Cardizem 60 daily as needed for heart rate running about 100.   DIET: Low sodium.   ACTIVITY: As tolerated.   FOLLOW-UP: With primary M.D. in 1 to 2 weeks. Follow with Dr. Gwen PoundsKowalski in 1 to 2 weeks. Follow with Dr. Thedore MinsSingh in 4 to 6 weeks. The patient is told not to take the HCTZ Benicar until seen by primary care provider.   TIME SPENT ON DISCHARGE:  32 minutes.     ____________________________ Lacie ScottsShreyang H. Allena KatzPatel, MD shp:dp D: 02/21/2013 14:17:01 ET Reed: 02/21/2013 15:04:34 ET JOB#: [id number]  cc: Howard Bunte H. Allena KatzPatel, MD, <Dictator> Charise CarwinSHREYANG H Floris Neuhaus MD ELECTRONICALLY SIGNED 02/22/2013 8:32

## 2014-06-12 NOTE — H&P (Signed)
PATIENT NAME:  [name], [name] MR#:  [id number] DATE OF BIRTH:  Jun 02, 1945  DATE OF ADMISSION:  02/20/2013  PRIMARY CARE PHYSICIAN:  Elizabeth Sauer, M.D.   CARDIOLOGIST:  Dr. Gwen Pounds   CHIEF COMPLAINT:  Dizzy spells.   HISTORY OF PRESENT ILLNESS:  This is a 69 year old man who had a left total knee replacement done by Dr. Ernest Pine. That was done on 12/22. He was discharged home on 12/24. On the 28th, he started having some heart racing spells and dizziness, feeling like he is going to pass out. He attributed that to the tramadol and Lovenox that he was taking. He called up Dr. Elenor Legato office and then Dr. Philemon Kingdom office. He was seen in the office, found to have a fast heart rate and sent over to the ER. The ER physician gave him a dose of adenosine and then put him on a Cardizem drip. He broke back to normal sinus rhythm and currently is feeling fine. No complaints of chest pain. He did have shortness of breath with the fast heart rate and palpitations and felt dizzy. His blood pressure has been high for the past couple of days. As per the family has not been eating and drinking very well or as well as he normally does. In the ER, he had SVT up into the 180s. Creatinine worse than it has previously been.   PAST MEDICAL HISTORY: 1.  SVT.  2.  Coronary artery disease.  3.  Hypothyroidism.  4.  Gout.  5.  Arthritis.  6.  Chronic kidney disease.   PAST SURGICAL HISTORY: 1.  Peptic ulcer disease requiring 3/4 of the stomach removed.  2.  CABG.  3.  Left total knee replacement.   ALLERGIES:  No known drug allergies.   MEDICATIONS:  As per prescription writer include:  1.  Allopurinol 100 mg daily.  2.  Benicar HCT 40/25 one tablet daily.  3.  Enoxaparin 40 mg subcutaneous injection x 14 days.  4.  Ferrous sulfate 325 mg in the morning.  5.  Lovastatin 20 mg daily.  6.  Metoprolol ER 50 mg daily.  7.  Synthroid 100 mcg daily.  8.  Tramadol 50 mg twice a day.   SOCIAL HISTORY:  No smoking.  No alcohol. No drug use. Retired Production designer, theatre/television/film.   FAMILY HISTORY:  The patient refused to give me his family history, states that he does not want to go into this.  REVIEW OF SYSTEMS:  CONSTITUTIONAL:  Positive for fever last week. Positive for sweats today, no chills. No weight loss.  EYES:  He does wear glasses.  EARS, NOSE, MOUTH AND THROAT:  No hearing loss. No sore throat. No difficulty swallowing.  CARDIOVASCULAR:  No chest pain. Positive for palpitations.  RESPIRATORY:  Positive for shortness of breath. No cough. No sputum. No hemoptysis.  GASTROINTESTINAL:  No nausea. No vomiting. No abdominal pain. No diarrhea. No constipation. No bright-red blood per rectum. No melena.  GENITOURINARY:  Did have blood in the urine with Foley catheter with the surgery, but that has resolved.  NEUROLOGICAL:  Feeling dizzy.  INTEGUMENT:  No rashes or eruptions.  MUSCULOSKELETAL:  Some knee pain.  PSYCHIATRIC:  No anxiety or depression.  ENDOCRINE:  Positive for hypothyroidism.  HEMATOLOGIC AND LYMPHATIC:  No anemia.   PHYSICAL EXAMINATION:  VITAL SIGNS:  Temperature 97.3, pulse 77, respirations 18, blood pressure 112/58, pulse ox 100% on oxygen.  VITAL SIGNS:  When he came into the Emergency Room included:  Heart rate  181, respirations 24, blood pressure 105/59, pulse ox 86% on room air.  GENERAL:  No respiratory distress, sitting in bed comfortably.  EYES:  Conjunctivae and lids normal. Pupils equal, round and reactive to light. Extraocular muscles intact. No nystagmus. EARS, NOSE, MOUTH AND THROAT:  Tympanic membranes:  No erythema. Nasal mucosa:  No erythema. Throat:  No erythema. No exudate seen. Lips and Gums:  No lesions.  NECK:  No JVD. No bruits. No lymphadenopathy. No thyromegaly. No thyroid nodules palpated.  LUNGS:  Clear to auscultation. No use of accessory muscles to breathe. No rhonchi, rales, or wheeze heard.  CARDIOVASCULAR:  S1, S2 normal. No gallops, rubs or murmurs heard. Carotid  upstroke 2+ bilaterally. No bruits.  EXTREMITIES:  Dorsalis pedis pulses 2+ bilaterally. No edema of the lower extremity.  ABDOMEN:  Soft, nontender. No organosplenomegaly. Normoactive bowel sounds. No masses felt.  LYMPHATIC:  No lymph nodes in the neck.  MUSCULOSKELETAL:  No clubbing. No edema. No cyanosis.  SKIN:  No ulcers or lesions seen.  NEUROLOGIC:  Cranial nerves II through XII grossly intact. Did not test deep tendon reflexes since he just had a knee replacement.  PSYCHIATRIC:  Oriented to person, place and time.   LABORATORY AND RADIOLOGICAL DATA:  Numerous EKGs with SVT. When he did break, he does have an underlying right bundle branch block. Chest x-ray negative. Glucose 119, BUN 37, creatinine 2.42, sodium 132, potassium 3.9, chloride 99, CO2 21, calcium 9.4. Liver function tests:  Total bilirubin 2.0. Liver function tests normal. Albumin 3.3. White blood cell count 16.6, H and H 11.1 and 33.0, platelet count of 559. Troponin negative. TSH 5.82. Last creatinine on 12/24 was 1.73.   ASSESSMENT AND PLAN:  1.  Supraventricular tachycardia status post adenosine and was placed on Cardizem drip. The patient broke into normal sinus rhythm. Will get off Cardizem drip, admit to telemetry, get stat oral Cardizem and Cardizem 60 mg q.6 hours along with the patient's metoprolol. I think dehydration probably triggered this. The patient's creatinine is worse today than it was on discharge.  2.  Acute renal failure on chronic kidney disease. Stop Benicar HCT, give IV fluids, recheck a creatinine in the a.m. We will ambulate the patient and see how he does.  3.  Hypertension. We will hold Benicar HCT, continue other medications and add Cardizem.  4.  Coronary artery disease. No symptoms at this point. We will check serial cardiac enzymes and give an aspirin.  5.  A history of gout on low-dose allopurinol.  6.  Hypothyroidism. On levothyroxine, continue the same dose at this point.  7.  Leukocytosis  likely reactive from the supraventricular tachycardia. We will monitor again in the a.m.   CODE STATUS:  The patient is a FULL CODE.  TIME SPENT ON ADMISSION:  55 minutes.   ____________________________ Herschell Dimesichard J. Renae GlossWieting, MD rjw:jm D: 02/20/2013 15:24:44 ET T: 02/20/2013 15:59:16 ET JOB#: [id number]  cc: Herschell Dimesichard J. Renae GlossWieting, MD, <Dictator> Duanne Limerickeanna C. Jones, MD Lamar BlinksBruce J. Kowalski, MD Salley ScarletICHARD J Eldredge Veldhuizen MD ELECTRONICALLY SIGNED 02/20/2013 16:52

## 2014-06-12 NOTE — Consult Note (Signed)
Brief Consult Note: Diagnosis: Pt with history of afib with rvr in the past has developed afib with rvr. Recently had tkr.Presented to our office with heart rate of 190.   Patient was seen by consultant.   Recommend further assessment or treatment.   Comments: Pt with history of tkr now with recurrent afib with rvr. Presented to our office this am and was noted to have a heart rate of 190 with wide qrs. Pt had similar ekg with rapid heart rate in the past. He was relatively hypotensive with the rhythm and he was sent to the er where he received iv adenocard with conversion to sr. He had recurrent svt which resonded to a bolus to iv cardizem. He is currently in nsr with po cardizem. Will monitor rate and consider discharge in am if he remians in nsr on cardizem.  Electronic Signatures: Dalia HeadingFath, Janeisha Ryle A (MD)  (Signed 02-Jan-15 19:30)  Authored: Brief Consult Note   Last Updated: 02-Jan-15 19:30 by Dalia HeadingFath, Jahzara Slattery A (MD)

## 2014-06-12 NOTE — Discharge Summary (Signed)
PATIENT NAME:  [name], [name] MR#:  [id number] DATE OF BIRTH:  04-24-1945  DATE OF ADMISSION:  02/09/2013 DATE OF DISCHARGE: 02/11/2013   ADMITTING DIAGNOSIS: Degenerative arthrosis of the left knee.   DISCHARGE DIAGNOSIS: Degenerative arthrosis of the left knee.   OPERATION: On 02/09/2013, the patient had a left total knee arthroplasty using computer-aided navigation.   SURGEON: Francesco Sor, M.D.   ASSISTANT: Devota Pace, NP.   ANESTHESIA: Spinal.   ESTIMATED BLOOD LOSS: 100 mL.  TOURNIQUET TIME: 160 minutes.   DRAINS: Two medium drains to reinfusion system.   IMPLANTS USED: DePuy PFC Sigma size 4 posterior stabilized femoral component (cemented), size 5 MBT tibial component (cemented), 38 mm 3-peg oval dome patella that was cemented, and 15 mm stabilized rotating platform polyethylene insert. Gentamicin cement was used. The patient was stabilized, brought to the recovery room and then brought down to the orthopedic floor for physical therapy and pain control.   HISTORY: The patient is a 69 year old male, who presented for upcoming left total knee arthroplasty. The patient has been having a long history of persistent pain and difficulties with activities of daily living. The patient has been refractory to conservative treatment with anti-inflammatories and cortisone injections.   PHYSICAL EXAMINATION:  GENERAL: Alert male with an antalgic gait involving his left leg.  LUNGS: Clear to auscultation.  CARDIOVASCULAR: Normal sinus rhythm.  MUSCULOSKELETAL: In regard to the left knee, the patient has no real effusion. The patient does have lateral joint line tenderness with a significant valgus attitude. The patient has full extension to 114 degrees of flexion.   HOSPITAL COURSE: After initial admission on February 09, 2013, the patient was brought to the orthopedic floor. On postoperative day 1, the patient ambulated with physical therapy 200 feet and on the day of discharge the patient  did ambulation with physical therapy including stairs. The patient had normal labs with a hemoglobin that dropped down to around 11.4 on postoperative day 2. The patient was stabilized. The patient was ready to go home with home health physical therapy on February 11, 2013.   CONDITION AT DISCHARGE: Stable.   DISPOSITION: The patient was sent home with home health physical therapy.   DISCHARGE INSTRUCTIONS: The patient will followup with [facility] orthopedics on February 24, 2013. The patient will do weight bear as tolerated on the affected leg. The patient will elevate his left leg with 1 to 2 pillows. The patient will use thigh-high TED hose on both legs, removed at bedtime. The patient will keep his heels off the bed and be encouraged to do cough and deep breathing. The patient's diet is regular. The patient will use Polar Care to decrease swelling. The patient will have a dressing change on a p.r.n. basis and by physical therapy. The patient will call the clinic if there is any bright red bleeding, calf pain, bowel or bladder difficulty, or any fever greater than 101.5. The patient will do home health physical therapy per protocol.   DISCHARGE MEDICATIONS: To resume home medications and additionally will use oxycodone 5 mg 1 tablet every 4 hours as needed for severe pain, Tramadol 50 mg 1 tablet as needed for less severe pain and Lovenox 40 mg subcutaneous once a day for 14 days and then discontinue and start on an aspirin 81 mg once a day.  ____________________________ Shela Commons. Dedra Skeens, [place] [email or web address] D: 02/11/2013 06:36:59 ET T: 02/11/2013 06:44:38 ET JOB#: [id number]  cc: J. Dedra Skeens, PA, <Dictator> J [facility] ELECTRONICALLY  SIGNED 02/21/2013 6:22

## 2014-07-03 LAB — LIPID PANEL
Cholesterol: 129 mg/dL (ref 0–200)
HDL: 31 mg/dL — AB (ref 35–70)
LDL Cholesterol: 70 mg/dL
Triglycerides: 138 mg/dL (ref 40–160)

## 2014-07-03 LAB — BASIC METABOLIC PANEL
BUN: 37 mg/dL — AB (ref 4–21)
Creatinine: 2 mg/dL — AB (ref <=1.3)
Glucose: 97 mg/dL

## 2014-07-05 DIAGNOSIS — M1710 Unilateral primary osteoarthritis, unspecified knee: Secondary | ICD-10-CM | POA: Insufficient documentation

## 2014-07-05 DIAGNOSIS — I10 Essential (primary) hypertension: Secondary | ICD-10-CM | POA: Insufficient documentation

## 2014-07-05 DIAGNOSIS — E039 Hypothyroidism, unspecified: Secondary | ICD-10-CM | POA: Insufficient documentation

## 2014-07-05 DIAGNOSIS — I4891 Unspecified atrial fibrillation: Secondary | ICD-10-CM | POA: Insufficient documentation

## 2014-07-05 DIAGNOSIS — I32 Pericarditis in diseases classified elsewhere: Secondary | ICD-10-CM

## 2014-07-05 DIAGNOSIS — M179 Osteoarthritis of knee, unspecified: Secondary | ICD-10-CM | POA: Insufficient documentation

## 2014-07-05 DIAGNOSIS — E7849 Other hyperlipidemia: Secondary | ICD-10-CM | POA: Insufficient documentation

## 2014-07-05 DIAGNOSIS — I2510 Atherosclerotic heart disease of native coronary artery without angina pectoris: Secondary | ICD-10-CM | POA: Insufficient documentation

## 2014-07-05 DIAGNOSIS — D649 Anemia, unspecified: Secondary | ICD-10-CM | POA: Insufficient documentation

## 2014-07-05 DIAGNOSIS — M109 Gout, unspecified: Secondary | ICD-10-CM | POA: Insufficient documentation

## 2014-08-04 IMAGING — CR DG KNEE 1-2V*L*
1 series · 2 of 2 positions shown · non-contrast
Comparison: None.

CLINICAL DATA: Left knee replacement.

EXAM:
LEFT KNEE - 1-2 VIEW

[Series 1: ap · 0.17mm/px · 2 of 2 slices shown]
[im 1/2]
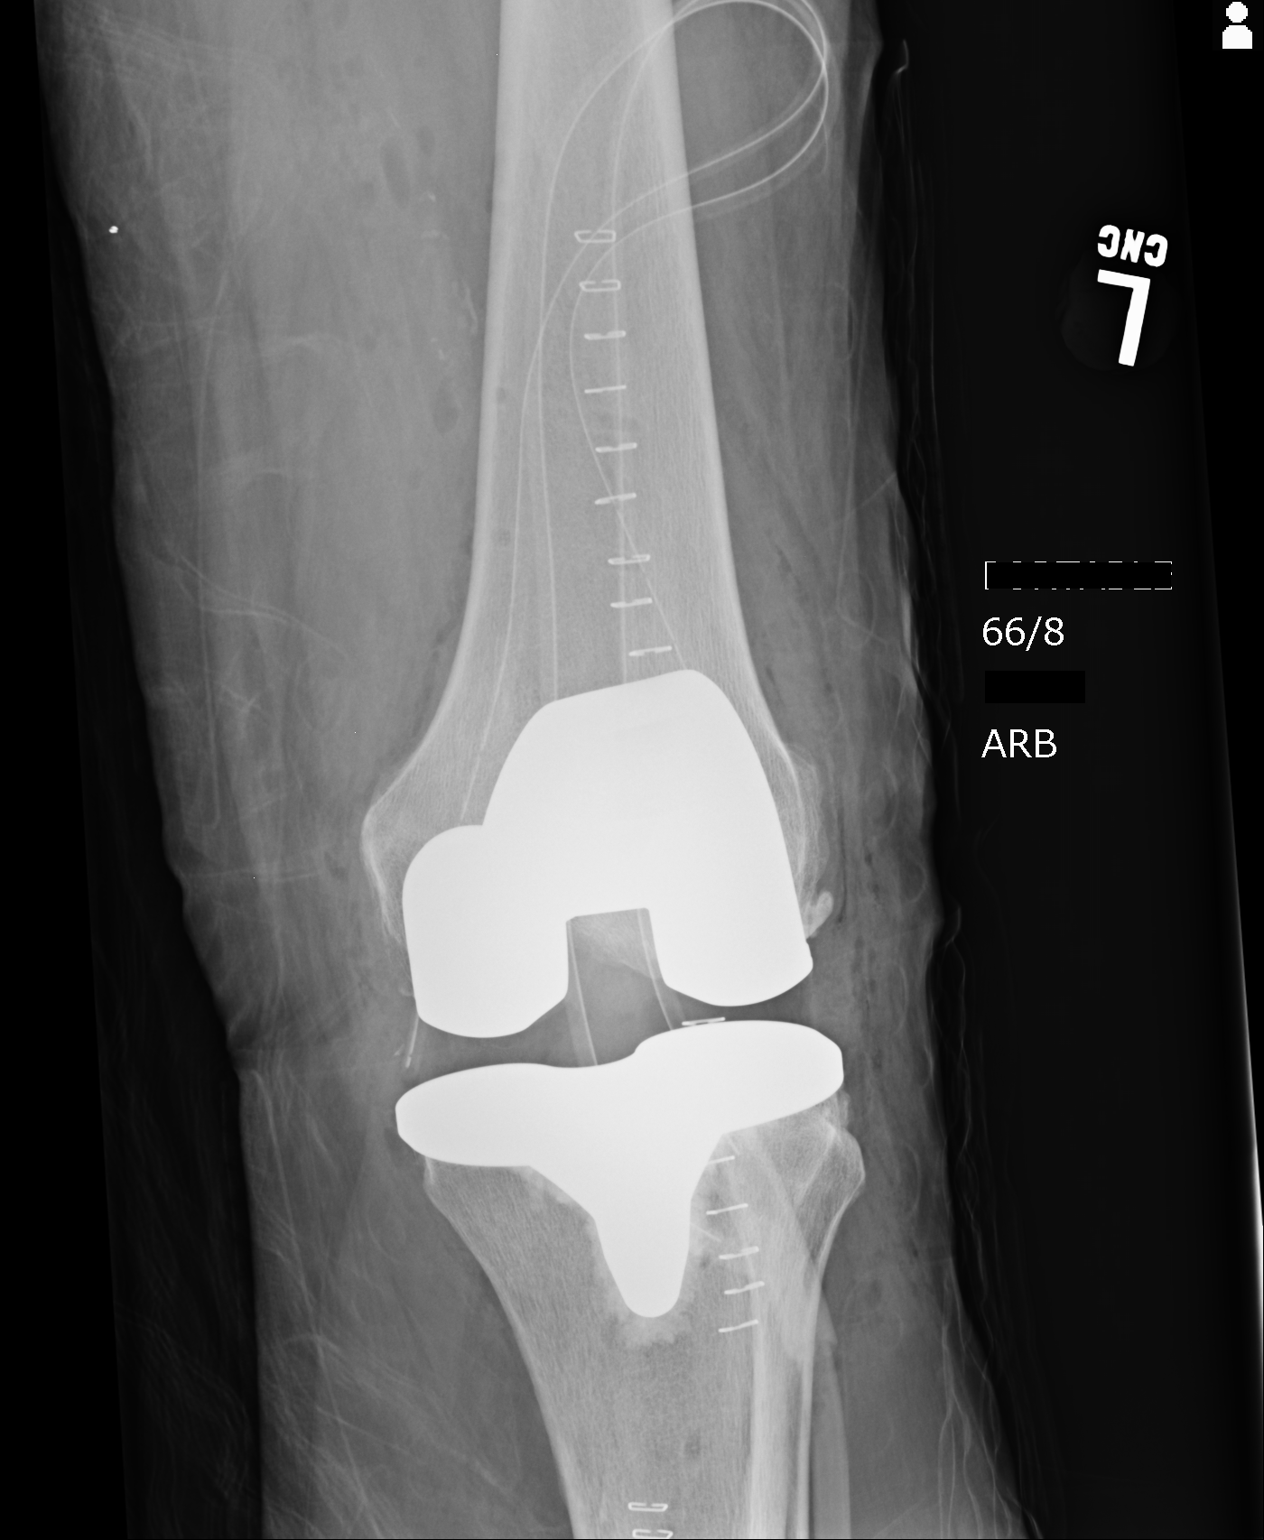
[im 2/2]
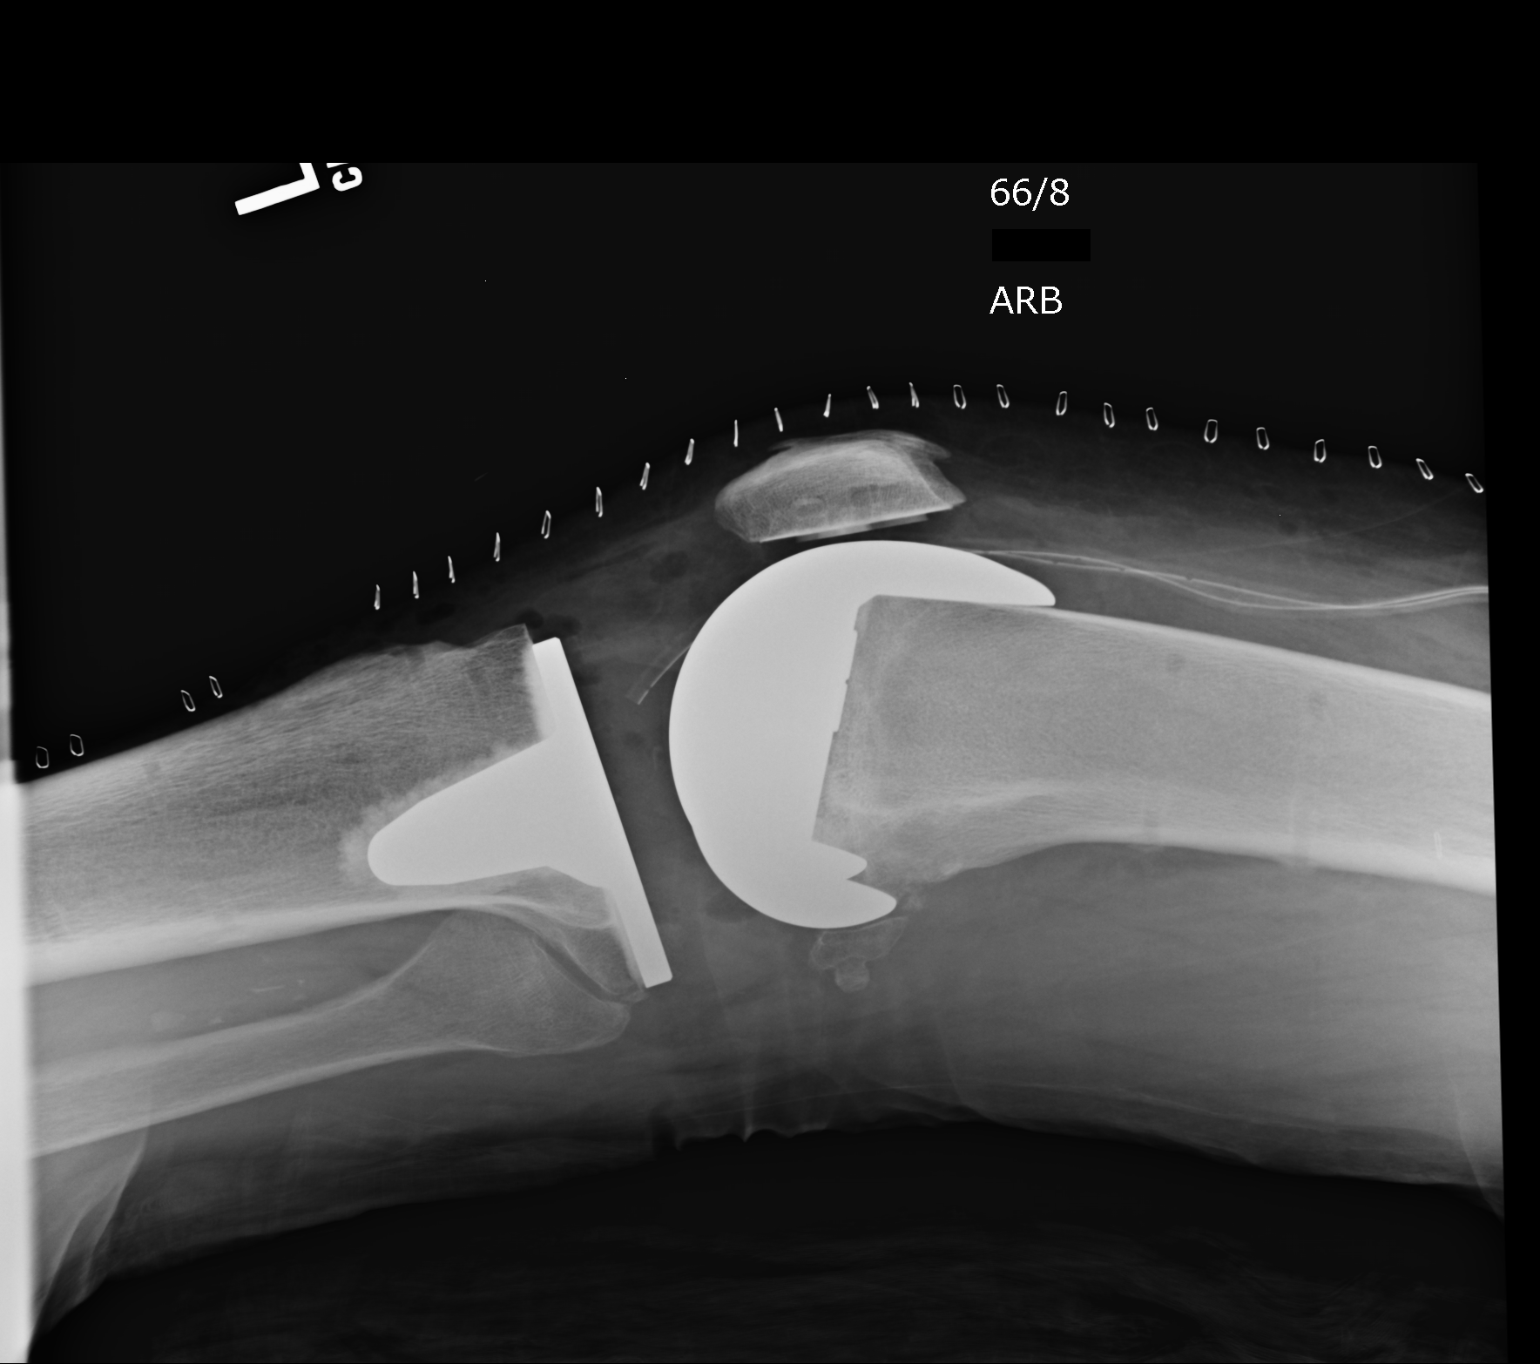

[2 of 2 positions shown; findings below may reference images not displayed]

FINDINGS: Patient status post total left knee replacement. Surgical drainage
tubes noted over the knee. Surgical staples are noted. There is good
anatomic alignment. Prostheses appear well seated within bone. No
acute bony abnormality identified. Soft tissue air noted consistent
recent surgery.
IMPRESSION: Good anatomic alignment following total left knee replacement.

## 2015-01-13 ENCOUNTER — Other Ambulatory Visit: Payer: Self-pay | Admitting: Family Medicine

## 2015-01-17 ENCOUNTER — Other Ambulatory Visit: Payer: Self-pay

## 2015-01-18 ENCOUNTER — Ambulatory Visit (INDEPENDENT_AMBULATORY_CARE_PROVIDER_SITE_OTHER): Payer: Managed Care, Other (non HMO) | Admitting: Family Medicine

## 2015-01-18 ENCOUNTER — Encounter: Payer: Self-pay | Admitting: Family Medicine

## 2015-01-18 VITALS — BP 122/88 | HR 68 | Ht 72.0 in | Wt 216.2 lb

## 2015-01-18 DIAGNOSIS — E784 Other hyperlipidemia: Secondary | ICD-10-CM

## 2015-01-18 DIAGNOSIS — I38 Endocarditis, valve unspecified: Secondary | ICD-10-CM | POA: Insufficient documentation

## 2015-01-18 DIAGNOSIS — Z862 Personal history of diseases of the blood and blood-forming organs and certain disorders involving the immune mechanism: Secondary | ICD-10-CM | POA: Diagnosis not present

## 2015-01-18 DIAGNOSIS — E7849 Other hyperlipidemia: Secondary | ICD-10-CM

## 2015-01-18 DIAGNOSIS — E039 Hypothyroidism, unspecified: Secondary | ICD-10-CM

## 2015-01-18 DIAGNOSIS — M1A9XX0 Chronic gout, unspecified, without tophus (tophi): Secondary | ICD-10-CM | POA: Diagnosis not present

## 2015-01-18 DIAGNOSIS — I10 Essential (primary) hypertension: Secondary | ICD-10-CM | POA: Diagnosis not present

## 2015-01-18 MED ORDER — METOPROLOL SUCCINATE ER 50 MG PO TB24: 50.0000 mg | ORAL_TABLET | Freq: Every day | ORAL | Status: DC

## 2015-01-18 MED ORDER — ALLOPURINOL 100 MG PO TABS: 100.0000 mg | ORAL_TABLET | Freq: Every day | ORAL | Status: DC

## 2015-01-18 MED ORDER — OLMESARTAN MEDOXOMIL-HCTZ 40-25 MG PO TABS: 1.0000 | ORAL_TABLET | Freq: Every day | ORAL | Status: DC

## 2015-01-18 MED ORDER — LOVASTATIN 20 MG PO TABS: 20.0000 mg | ORAL_TABLET | Freq: Every day | ORAL | Status: DC

## 2015-01-18 MED ORDER — DILTIAZEM HCL ER 60 MG PO CP12: 60.0000 mg | ORAL_CAPSULE | ORAL | Status: DC | PRN

## 2015-01-18 NOTE — Progress Notes (Signed)
Name: [name]   MRN: [id number]    DOB: Oct 13, 1945   Date:01/18/2015       Progress Note  Subjective  Chief Complaint  Chief Complaint  Patient presents with  . Hypertension  . Hypothyroidism  . Hyperlipidemia    Hypertension This is a chronic problem. The current episode started more than 1 year ago. The problem has been waxing and waning since onset. The problem is controlled. Pertinent negatives include no anxiety, blurred vision, chest pain, headaches, malaise/fatigue, neck pain, orthopnea, palpitations, peripheral edema, PND, shortness of breath or sweats. There are no associated agents to hypertension. There are no known risk factors for coronary artery disease. Past treatments include calcium channel blockers and beta blockers. There are no compliance problems.  Hypertensive end-organ damage includes a thyroid problem. There is no history of angina, kidney disease, CAD/MI, CVA, heart failure, left ventricular hypertrophy, PVD, renovascular disease or retinopathy. There is no history of chronic renal disease or a hypertension causing med.  Hyperlipidemia This is a chronic problem. The current episode started more than 1 year ago. The problem is controlled. Recent lipid tests were reviewed and are normal. He has no history of chronic renal disease or diabetes. There are no known factors aggravating his hyperlipidemia. Pertinent negatives include no chest pain, focal weakness, myalgias or shortness of breath. He is currently on no antihyperlipidemic treatment. The current treatment provides no improvement of lipids. There are no compliance problems.  There are no known risk factors for coronary artery disease.  Thyroid Problem Presents for follow-up visit. Patient reports no anxiety, cold intolerance, constipation, depressed mood, diaphoresis, diarrhea, dry skin, fatigue, hair loss, heat intolerance, hoarse voice, leg swelling, menstrual problem, nail problem, palpitations, tremors,  visual change, weight gain or weight loss. The symptoms have been stable. Past treatments include nothing. The treatment provided mild relief. His past medical history is significant for atrial fibrillation and hyperlipidemia. There is no history of dementia, diabetes, Graves' ophthalmopathy, heart failure, neuropathy, obesity or osteopenia. There are no known risk factors.    No problem-specific assessment & plan notes found for this encounter.   No past medical history on file.  Past Surgical History  Procedure Laterality Date  . Peptic ulcer      removed  . Tonsillectomy    . Cardiac surgery      quad. bypass  . Appendectomy      No family history on file.  Social History   Social History  . Marital Status: Married    Spouse Name: N/A  . Number of Children: N/A  . Years of Education: N/A   Occupational History  . Not on file.   Social History Main Topics  . Smoking status: Never Smoker   . Smokeless tobacco: Current User    Types: Chew  . Alcohol Use: 0.0 oz/week    0 Standard drinks or equivalent per week  . Drug Use: No  . Sexual Activity: Not on file   Other Topics Concern  . Not on file   Social History Narrative    No Known Allergies   Review of Systems  Constitutional: Negative for fever, chills, weight loss, weight gain, malaise/fatigue, diaphoresis and fatigue.  HENT: Negative for ear discharge, ear pain, hoarse voice and sore throat.   Eyes: Negative for blurred vision.  Respiratory: Negative for cough, sputum production, shortness of breath and wheezing.   Cardiovascular: Negative for chest pain, palpitations, orthopnea, leg swelling and PND.  Gastrointestinal: Negative for heartburn, nausea,  abdominal pain, diarrhea, constipation, blood in stool and melena.  Genitourinary: Negative for dysuria, urgency, frequency, hematuria and menstrual problem.  Musculoskeletal: Negative for myalgias, back pain, joint pain and neck pain.  Skin: Negative for  rash.  Neurological: Negative for dizziness, tingling, tremors, sensory change, focal weakness and headaches.  Endo/Heme/Allergies: Negative for environmental allergies, cold intolerance, heat intolerance and polydipsia. Does not bruise/bleed easily.  Psychiatric/Behavioral: Negative for depression and suicidal ideas. The patient is not nervous/anxious and does not have insomnia.      Objective  Filed Vitals:   01/18/15 0842  BP: 122/88  Pulse: 68  Height: 6' (1.829 m)  Weight: 216 lb 3.2 oz (98.068 kg)    Physical Exam  Constitutional: He is oriented to person, place, and time and well-developed, well-nourished, and in no distress.  HENT:  Head: Normocephalic.  Right Ear: External ear normal.  Left Ear: External ear normal.  Nose: Nose normal.  Mouth/Throat: Oropharynx is clear and moist.  Eyes: Conjunctivae and EOM are normal. Pupils are equal, round, and reactive to light. Right eye exhibits no discharge. Left eye exhibits no discharge. No scleral icterus.  Neck: Normal range of motion. Neck supple. No JVD present. No tracheal deviation present. No thyromegaly present.  Cardiovascular: Normal rate, regular rhythm, normal heart sounds and intact distal pulses.  Exam reveals no gallop and no friction rub.   No murmur heard. Pulmonary/Chest: Breath sounds normal. No respiratory distress. He has no wheezes. He has no rales.  Abdominal: Soft. Bowel sounds are normal. He exhibits no mass. There is no hepatosplenomegaly. There is no tenderness. There is no rebound, no guarding and no CVA tenderness.  Musculoskeletal: Normal range of motion. He exhibits no edema or tenderness.  Lymphadenopathy:    He has no cervical adenopathy.  Neurological: He is alert and oriented to person, place, and time. He has normal sensation, normal strength, normal reflexes and intact cranial nerves. No cranial nerve deficit.  Skin: Skin is warm. No rash noted.  Psychiatric: Mood and affect normal.  Nursing  note and vitals reviewed.     Assessment & Plan  Problem List Items Addressed This Visit      Cardiovascular and Mediastinum   Benign essential HTN - Primary   Relevant Medications   diltiazem (CARDIZEM SR) 60 MG 12 hr capsule   lovastatin (MEVACOR) 20 MG tablet   metoprolol succinate (TOPROL-XL) 50 MG 24 hr tablet   olmesartan-hydrochlorothiazide (BENICAR HCT) 40-25 MG tablet     Endocrine   Adult hypothyroidism   Relevant Medications   metoprolol succinate (TOPROL-XL) 50 MG 24 hr tablet   Other Relevant Orders   TSH     Other   Familial multiple lipoprotein-type hyperlipidemia   Relevant Medications   diltiazem (CARDIZEM SR) 60 MG 12 hr capsule   lovastatin (MEVACOR) 20 MG tablet   metoprolol succinate (TOPROL-XL) 50 MG 24 hr tablet   olmesartan-hydrochlorothiazide (BENICAR HCT) 40-25 MG tablet   Other Relevant Orders   Lipid Profile   Gout   Relevant Medications   allopurinol (ZYLOPRIM) 100 MG tablet    Other Visit Diagnoses    Hx of iron deficiency anemia        Relevant Orders    CBC w/Diff/Platelet         Dr. Hayden Rasmusseneanna Jones [facility]  01/18/2015

## 2015-01-19 LAB — CBC WITH DIFFERENTIAL/PLATELET
BASOS ABS: 0 10*3/uL (ref 0.0–0.2)
Basos: 1 %
EOS (ABSOLUTE): 0.3 10*3/uL (ref 0.0–0.4)
Eos: 5 %
HEMATOCRIT: 47.3 % (ref 37.5–51.0)
HEMOGLOBIN: 15.3 g/dL (ref 12.6–17.7)
IMMATURE GRANULOCYTES: 0 %
Immature Grans (Abs): 0 10*3/uL (ref 0.0–0.1)
Lymphocytes Absolute: 1 10*3/uL (ref 0.7–3.1)
Lymphs: 15 %
MCH: 28.3 pg (ref 26.6–33.0)
MCHC: 32.3 g/dL (ref 31.5–35.7)
MCV: 87 fL (ref 79–97)
MONOCYTES: 13 %
Monocytes Absolute: 0.9 10*3/uL (ref 0.1–0.9)
Neutrophils Absolute: 4.5 10*3/uL (ref 1.4–7.0)
Neutrophils: 66 %
Platelets: 218 10*3/uL (ref 150–379)
RBC: 5.41 x10E6/uL (ref 4.14–5.80)
RDW: 14.6 % (ref 12.3–15.4)
WBC: 6.8 10*3/uL (ref 3.4–10.8)

## 2015-01-19 LAB — LIPID PANEL
Chol/HDL Ratio: 4.3 ratio units (ref 0.0–5.0)
Cholesterol, Total: 132 mg/dL (ref 100–199)
HDL: 31 mg/dL — ABNORMAL LOW (ref >39)
LDL CALC: 70 mg/dL (ref 0–99)
Triglycerides: 156 mg/dL — ABNORMAL HIGH (ref 0–149)
VLDL CHOLESTEROL CAL: 31 mg/dL (ref 5–40)

## 2015-01-19 LAB — TSH: TSH: 4.24 u[IU]/mL (ref 0.450–4.500)

## 2015-03-04 ENCOUNTER — Other Ambulatory Visit: Payer: Self-pay | Admitting: Family Medicine

## 2015-03-14 ENCOUNTER — Other Ambulatory Visit: Payer: Self-pay

## 2015-07-19 ENCOUNTER — Ambulatory Visit (INDEPENDENT_AMBULATORY_CARE_PROVIDER_SITE_OTHER): Payer: Managed Care, Other (non HMO) | Admitting: Family Medicine

## 2015-07-19 ENCOUNTER — Encounter: Payer: Self-pay | Admitting: Family Medicine

## 2015-07-19 VITALS — BP 148/88 | HR 62 | Ht 72.0 in | Wt 211.0 lb

## 2015-07-19 DIAGNOSIS — D649 Anemia, unspecified: Secondary | ICD-10-CM | POA: Insufficient documentation

## 2015-07-19 DIAGNOSIS — E039 Hypothyroidism, unspecified: Secondary | ICD-10-CM

## 2015-07-19 DIAGNOSIS — I480 Paroxysmal atrial fibrillation: Secondary | ICD-10-CM

## 2015-07-19 DIAGNOSIS — E784 Other hyperlipidemia: Secondary | ICD-10-CM | POA: Diagnosis not present

## 2015-07-19 DIAGNOSIS — I10 Essential (primary) hypertension: Secondary | ICD-10-CM | POA: Diagnosis not present

## 2015-07-19 DIAGNOSIS — D631 Anemia in chronic kidney disease: Secondary | ICD-10-CM | POA: Diagnosis not present

## 2015-07-19 DIAGNOSIS — M1A9XX0 Chronic gout, unspecified, without tophus (tophi): Secondary | ICD-10-CM | POA: Diagnosis not present

## 2015-07-19 DIAGNOSIS — I25810 Atherosclerosis of coronary artery bypass graft(s) without angina pectoris: Secondary | ICD-10-CM | POA: Diagnosis not present

## 2015-07-19 DIAGNOSIS — E7849 Other hyperlipidemia: Secondary | ICD-10-CM

## 2015-07-19 DIAGNOSIS — I700 Atherosclerosis of aorta: Secondary | ICD-10-CM | POA: Diagnosis not present

## 2015-07-19 DIAGNOSIS — N189 Chronic kidney disease, unspecified: Secondary | ICD-10-CM

## 2015-07-19 MED ORDER — METOPROLOL SUCCINATE ER 50 MG PO TB24: 50.0000 mg | ORAL_TABLET | Freq: Every day | ORAL | Status: DC

## 2015-07-19 MED ORDER — ALLOPURINOL 100 MG PO TABS: 100.0000 mg | ORAL_TABLET | Freq: Every day | ORAL | Status: DC

## 2015-07-19 MED ORDER — DILTIAZEM HCL ER 240 MG PO CP24: 240.0000 mg | ORAL_CAPSULE | Freq: Every day | ORAL | Status: DC

## 2015-07-19 MED ORDER — LEVOTHYROXINE SODIUM 100 MCG PO TABS: 100.0000 ug | ORAL_TABLET | Freq: Every day | ORAL | Status: DC

## 2015-07-19 MED ORDER — OLMESARTAN MEDOXOMIL-HCTZ 40-25 MG PO TABS: 1.0000 | ORAL_TABLET | Freq: Every day | ORAL | Status: DC

## 2015-07-19 MED ORDER — LOVASTATIN 20 MG PO TABS: 20.0000 mg | ORAL_TABLET | Freq: Every day | ORAL | Status: DC

## 2015-07-19 NOTE — Progress Notes (Signed)
Name: [name]   MRN: [id number]    DOB: [birth year]   Date:07/19/2015       Progress Note  Subjective  Chief Complaint  Chief Complaint  Patient presents with  . Hypertension  . Hypothyroidism  . Hyperlipidemia  . Gout    Hypertension This is a chronic problem. The current episode started more than 1 year ago. The problem has been gradually improving since onset. The problem is uncontrolled. Pertinent negatives include no anxiety, blurred vision, chest pain, headaches, malaise/fatigue, neck pain, orthopnea, palpitations, peripheral edema, PND, shortness of breath or sweats. There are no associated agents to hypertension. Risk factors for coronary artery disease include dyslipidemia, male gender and obesity. Past treatments include calcium channel blockers, beta blockers, diuretics and ACE inhibitors. The current treatment provides moderate improvement. There are no compliance problems.  Hypertensive end-organ damage includes a thyroid problem. There is no history of angina, kidney disease, CAD/MI, CVA, heart failure, left ventricular hypertrophy, PVD, renovascular disease or retinopathy. There is no history of chronic renal disease or a hypertension causing med.  Hyperlipidemia This is a chronic problem. The current episode started more than 1 year ago. The problem is controlled. Recent lipid tests were reviewed and are normal. He has no history of chronic renal disease, diabetes, hypothyroidism, liver disease, obesity or nephrotic syndrome. There are no known factors aggravating his hyperlipidemia. Pertinent negatives include no chest pain, focal weakness, myalgias or shortness of breath. Current antihyperlipidemic treatment includes statins. The current treatment provides moderate improvement of lipids. There are no compliance problems.  Risk factors for coronary artery disease include dyslipidemia, hypertension, male sex and obesity.  Thyroid Problem Presents for follow-up visit. Patient  reports no anxiety, cold intolerance, constipation, depressed mood, diaphoresis, diarrhea, dry skin, fatigue, hair loss, heat intolerance, hoarse voice, leg swelling, menstrual problem, nail problem, palpitations, tremors, visual change, weight gain or weight loss. The symptoms have been stable. Past treatments include levothyroxine. The treatment provided moderate relief. The following procedures have not been performed: radioiodine uptake scan, thyroid FNA, thyroid ultrasound and thyroidectomy. His past medical history is significant for hyperlipidemia. There is no history of atrial fibrillation, diabetes or heart failure.    No problem-specific assessment & plan notes found for this encounter.   History reviewed. No pertinent past medical history.  Past Surgical History  Procedure Laterality Date  . Peptic ulcer      removed  . Tonsillectomy    . Cardiac surgery      quad. bypass  . Appendectomy      No family history on file.  Social History   Social History  . Marital Status: Married    Spouse Name: N/A  . Number of Children: N/A  . Years of Education: N/A   Occupational History  . Not on file.   Social History Main Topics  . Smoking status: Never Smoker   . Smokeless tobacco: Current User    Types: Chew  . Alcohol Use: 0.0 oz/week    0 Standard drinks or equivalent per week  . Drug Use: No  . Sexual Activity: Not on file   Other Topics Concern  . Not on file   Social History Narrative    No Known Allergies   Review of Systems  Constitutional: Negative for fever, chills, weight loss, weight gain, malaise/fatigue, diaphoresis and fatigue.  HENT: Negative for ear discharge, ear pain, hoarse voice and sore throat.   Eyes: Negative for blurred vision.  Respiratory: Negative for cough, sputum production,  shortness of breath and wheezing.   Cardiovascular: Negative for chest pain, palpitations, orthopnea, leg swelling and PND.  Gastrointestinal: Negative for  heartburn, nausea, abdominal pain, diarrhea, constipation, blood in stool and melena.  Genitourinary: Negative for dysuria, urgency, frequency, hematuria and menstrual problem.  Musculoskeletal: Negative for myalgias, back pain, joint pain and neck pain.  Skin: Negative for rash.  Neurological: Negative for dizziness, tingling, tremors, sensory change, focal weakness and headaches.  Endo/Heme/Allergies: Negative for environmental allergies, cold intolerance, heat intolerance and polydipsia. Does not bruise/bleed easily.  Psychiatric/Behavioral: Negative for depression and suicidal ideas. The patient is not nervous/anxious and does not have insomnia.      Objective  Filed Vitals:   07/19/15 0859  BP: 148/88  Pulse: 62  Height: 6' (1.829 m)  Weight: 211 lb (95.709 kg)    Physical Exam  Constitutional: He is oriented to person, place, and time and well-developed, well-nourished, and in no distress.  HENT:  Head: Normocephalic.  Right Ear: External ear normal.  Left Ear: External ear normal.  Nose: Nose normal.  Mouth/Throat: Oropharynx is clear and moist.  Eyes: Conjunctivae and EOM are normal. Pupils are equal, round, and reactive to light. Right eye exhibits no discharge. Left eye exhibits no discharge. No scleral icterus.  Neck: Normal range of motion. Neck supple. No JVD present. No tracheal deviation present. No thyromegaly present.  Cardiovascular: Normal rate, regular rhythm, normal heart sounds and intact distal pulses.  Exam reveals no gallop and no friction rub.   No murmur heard. Pulmonary/Chest: Breath sounds normal. No respiratory distress. He has no wheezes. He has no rales.  Abdominal: Soft. Bowel sounds are normal. He exhibits no mass. There is no hepatosplenomegaly. There is no tenderness. There is no rebound, no guarding and no CVA tenderness.  Musculoskeletal: Normal range of motion. He exhibits no edema or tenderness.  Lymphadenopathy:    He has no cervical  adenopathy.  Neurological: He is alert and oriented to person, place, and time. He has normal sensation, normal strength, normal reflexes and intact cranial nerves. No cranial nerve deficit.  Skin: Skin is warm. No rash noted.  Psychiatric: Mood and affect normal.  Nursing note and vitals reviewed.     Assessment & Plan  Problem List Items Addressed This Visit      Cardiovascular and Mediastinum   Coronary atherosclerosis   Relevant Medications   diltiazem (DILACOR XR) 240 MG 24 hr capsule   metoprolol succinate (TOPROL-XL) 50 MG 24 hr tablet   olmesartan-hydrochlorothiazide (BENICAR HCT) 40-25 MG tablet   lovastatin (MEVACOR) 20 MG tablet   A-fib (HCC)   Relevant Medications   diltiazem (DILACOR XR) 240 MG 24 hr capsule   metoprolol succinate (TOPROL-XL) 50 MG 24 hr tablet   olmesartan-hydrochlorothiazide (BENICAR HCT) 40-25 MG tablet   lovastatin (MEVACOR) 20 MG tablet   levothyroxine (SYNTHROID, LEVOTHROID) 100 MCG tablet   BP (high blood pressure) - Primary   Relevant Medications   diltiazem (DILACOR XR) 240 MG 24 hr capsule   metoprolol succinate (TOPROL-XL) 50 MG 24 hr tablet   olmesartan-hydrochlorothiazide (BENICAR HCT) 40-25 MG tablet   lovastatin (MEVACOR) 20 MG tablet   Other Relevant Orders   Renal Function Panel   Benign essential HTN   Relevant Medications   diltiazem (DILACOR XR) 240 MG 24 hr capsule   metoprolol succinate (TOPROL-XL) 50 MG 24 hr tablet   olmesartan-hydrochlorothiazide (BENICAR HCT) 40-25 MG tablet   lovastatin (MEVACOR) 20 MG tablet   Aortic atherosclerosis (HCC)  Relevant Medications   diltiazem (DILACOR XR) 240 MG 24 hr capsule   metoprolol succinate (TOPROL-XL) 50 MG 24 hr tablet   olmesartan-hydrochlorothiazide (BENICAR HCT) 40-25 MG tablet   lovastatin (MEVACOR) 20 MG tablet     Endocrine   Adult hypothyroidism   Relevant Medications   metoprolol succinate (TOPROL-XL) 50 MG 24 hr tablet   levothyroxine (SYNTHROID, LEVOTHROID)  100 MCG tablet   Other Relevant Orders   TSH     Genitourinary   Anemia of renal disease   Relevant Orders   CBC     Other   Familial multiple lipoprotein-type hyperlipidemia   Relevant Medications   diltiazem (DILACOR XR) 240 MG 24 hr capsule   metoprolol succinate (TOPROL-XL) 50 MG 24 hr tablet   olmesartan-hydrochlorothiazide (BENICAR HCT) 40-25 MG tablet   lovastatin (MEVACOR) 20 MG tablet   Other Relevant Orders   Lipid Profile   Gout   Relevant Medications   allopurinol (ZYLOPRIM) 100 MG tablet        Dr. Hayden [facility]  07/19/2015

## 2015-07-20 LAB — CBC
HEMATOCRIT: 45.1 % (ref 37.5–51.0)
HEMOGLOBIN: 14.9 g/dL (ref 12.6–17.7)
MCH: 27.9 pg (ref 26.6–33.0)
MCHC: 33 g/dL (ref 31.5–35.7)
MCV: 85 fL (ref 79–97)
Platelets: 197 10*3/uL (ref 150–379)
RBC: 5.34 x10E6/uL (ref 4.14–5.80)
RDW: 14.9 % (ref 12.3–15.4)
WBC: 6.9 10*3/uL (ref 3.4–10.8)

## 2015-07-20 LAB — LIPID PANEL
Chol/HDL Ratio: 4.1 ratio units (ref 0.0–5.0)
Cholesterol, Total: 124 mg/dL (ref 100–199)
HDL: 30 mg/dL — AB (ref >39)
LDL Calculated: 58 mg/dL (ref 0–99)
TRIGLYCERIDES: 182 mg/dL — AB (ref 0–149)
VLDL Cholesterol Cal: 36 mg/dL (ref 5–40)

## 2015-07-20 LAB — RENAL FUNCTION PANEL
Albumin: 4.8 g/dL (ref 3.5–4.8)
BUN/Creatinine Ratio: 16 (ref 10–24)
BUN: 24 mg/dL (ref 8–27)
CHLORIDE: 96 mmol/L (ref 96–106)
CO2: 24 mmol/L (ref 18–29)
CREATININE: 1.5 mg/dL — AB (ref 0.76–1.27)
Calcium: 10 mg/dL (ref 8.6–10.2)
GFR calc Af Amer: 53 mL/min/{1.73_m2} — ABNORMAL LOW (ref >59)
GFR calc non Af Amer: 46 mL/min/{1.73_m2} — ABNORMAL LOW (ref >59)
Glucose: 85 mg/dL (ref 65–99)
POTASSIUM: 4.8 mmol/L (ref 3.5–5.2)
Phosphorus: 2.5 mg/dL (ref 2.5–4.5)
Sodium: 138 mmol/L (ref 134–144)

## 2015-07-20 LAB — TSH: TSH: 2.66 u[IU]/mL (ref 0.450–4.500)

## 2015-07-28 DIAGNOSIS — R001 Bradycardia, unspecified: Secondary | ICD-10-CM | POA: Insufficient documentation

## 2016-01-02 ENCOUNTER — Ambulatory Visit (INDEPENDENT_AMBULATORY_CARE_PROVIDER_SITE_OTHER): Payer: Managed Care, Other (non HMO) | Admitting: Family Medicine

## 2016-01-02 ENCOUNTER — Encounter: Payer: Self-pay | Admitting: Family Medicine

## 2016-01-02 VITALS — BP 130/62 | HR 70 | Ht 72.0 in | Wt 212.0 lb

## 2016-01-02 DIAGNOSIS — I480 Paroxysmal atrial fibrillation: Secondary | ICD-10-CM | POA: Diagnosis not present

## 2016-01-02 DIAGNOSIS — E039 Hypothyroidism, unspecified: Secondary | ICD-10-CM

## 2016-01-02 DIAGNOSIS — I10 Essential (primary) hypertension: Secondary | ICD-10-CM | POA: Diagnosis not present

## 2016-01-02 DIAGNOSIS — E784 Other hyperlipidemia: Secondary | ICD-10-CM | POA: Diagnosis not present

## 2016-01-02 DIAGNOSIS — E7849 Other hyperlipidemia: Secondary | ICD-10-CM

## 2016-01-02 DIAGNOSIS — I700 Atherosclerosis of aorta: Secondary | ICD-10-CM | POA: Diagnosis not present

## 2016-01-02 DIAGNOSIS — M1A9XX0 Chronic gout, unspecified, without tophus (tophi): Secondary | ICD-10-CM

## 2016-01-02 MED ORDER — ALLOPURINOL 100 MG PO TABS: 100.0000 mg | ORAL_TABLET | Freq: Every day | ORAL | 1 refills | Status: DC

## 2016-01-02 MED ORDER — DILTIAZEM HCL ER 240 MG PO CP24
240.0000 mg | ORAL_CAPSULE | Freq: Every day | ORAL | 1 refills | Status: DC
Start: 2016-01-02 — End: 2016-03-13

## 2016-01-02 MED ORDER — METOPROLOL SUCCINATE ER 50 MG PO TB24: 50.0000 mg | ORAL_TABLET | Freq: Every day | ORAL | 1 refills | Status: DC

## 2016-01-02 MED ORDER — LEVOTHYROXINE SODIUM 100 MCG PO TABS: 100.0000 ug | ORAL_TABLET | Freq: Every day | ORAL | 1 refills | Status: DC

## 2016-01-02 MED ORDER — LOVASTATIN 20 MG PO TABS: 20.0000 mg | ORAL_TABLET | Freq: Every day | ORAL | 1 refills | Status: DC

## 2016-01-02 MED ORDER — OLMESARTAN MEDOXOMIL-HCTZ 40-25 MG PO TABS: 1.0000 | ORAL_TABLET | Freq: Every day | ORAL | 1 refills | Status: DC

## 2016-01-02 NOTE — Progress Notes (Signed)
Name: [name]   MRN: [id number]    DOB: Oct 18, 1945   Date:01/02/2016       Progress Note  Subjective  Chief Complaint  Chief Complaint  Patient presents with  . Hypothyroidism  . Hypertension  . Hyperlipidemia  . Gout    Hypertension  This is a chronic problem. The current episode started more than 1 year ago. The problem has been gradually improving since onset. The problem is controlled. Pertinent negatives include no anxiety, blurred vision, chest pain, headaches, malaise/fatigue, neck pain, orthopnea, palpitations, peripheral edema, PND, shortness of breath or sweats. There are no associated agents to hypertension. There are no known risk factors for coronary artery disease. Past treatments include ACE inhibitors, calcium channel blockers, beta blockers and diuretics. The current treatment provides moderate improvement. There are no compliance problems.  Hypertensive end-organ damage includes a thyroid problem. There is no history of angina, kidney disease, CAD/MI, CVA, heart failure, left ventricular hypertrophy, PVD, renovascular disease or retinopathy. There is no history of chronic renal disease or a hypertension causing med.  Hyperlipidemia  This is a chronic problem. The current episode started more than 1 year ago. The problem is controlled. Recent lipid tests were reviewed and are normal. He has no history of chronic renal disease, hypothyroidism, liver disease, obesity or nephrotic syndrome. Factors aggravating his hyperlipidemia include thiazides and beta blockers. Pertinent negatives include no chest pain, focal weakness, leg pain, myalgias or shortness of breath. He is currently on no antihyperlipidemic treatment. The current treatment provides mild improvement of lipids. There are no compliance problems.  Risk factors for coronary artery disease include dyslipidemia and hypertension.  Thyroid Problem  Presents for follow-up visit. Patient reports no anxiety, cold  intolerance, constipation, depressed mood, diaphoresis, diarrhea, dry skin, fatigue, hair loss, heat intolerance, hoarse voice, leg swelling, nail problem, palpitations, tremors, visual change, weight gain or weight loss. The symptoms have been stable. His past medical history is significant for hyperlipidemia. There is no history of heart failure.    No problem-specific Assessment & Plan notes found for this encounter.   Past Medical History:  Diagnosis Date  . Gout   . Hyperlipidemia   . Hypertension   . Thyroid disease     Past Surgical History:  Procedure Laterality Date  . APPENDECTOMY    . CARDIAC SURGERY     quad. bypass  . peptic ulcer     removed  . TONSILLECTOMY      History reviewed. No pertinent family history.  Social History   Social History  . Marital status: Married    Spouse name: N/A  . Number of children: N/A  . Years of education: N/A   Occupational History  . Not on file.   Social History Main Topics  . Smoking status: Never Smoker  . Smokeless tobacco: Current User    Types: Chew  . Alcohol use 0.0 oz/week  . Drug use: No  . Sexual activity: Not on file   Other Topics Concern  . Not on file   Social History Narrative  . No narrative on file    No Known Allergies   Review of Systems  Constitutional: Negative for chills, diaphoresis, fatigue, fever, malaise/fatigue, weight gain and weight loss.  HENT: Negative for ear discharge, ear pain, hoarse voice and sore throat.   Eyes: Negative for blurred vision.  Respiratory: Negative for cough, sputum production, shortness of breath and wheezing.   Cardiovascular: Negative for chest pain, palpitations, orthopnea, leg swelling and  PND.  Gastrointestinal: Negative for abdominal pain, blood in stool, constipation, diarrhea, heartburn, melena and nausea.  Genitourinary: Negative for dysuria, frequency, hematuria and urgency.  Musculoskeletal: Negative for back pain, joint pain, myalgias and neck  pain.  Skin: Negative for rash.  Neurological: Negative for dizziness, tingling, tremors, sensory change, focal weakness and headaches.  Endo/Heme/Allergies: Negative for environmental allergies, cold intolerance, heat intolerance and polydipsia. Does not bruise/bleed easily.  Psychiatric/Behavioral: Negative for depression and suicidal ideas. The patient is not nervous/anxious and does not have insomnia.      Objective  Vitals:   01/02/16 1002  BP: 130/62  Pulse: 70  Weight: 212 lb (96.2 kg)  Height: 6' (1.829 m)    Physical Exam  Constitutional: He is oriented to person, place, and time and well-developed, well-nourished, and in no distress.  HENT:  Head: Normocephalic.  Right Ear: External ear normal.  Left Ear: External ear normal.  Nose: Nose normal.  Mouth/Throat: Oropharynx is clear and moist.  Eyes: Conjunctivae and EOM are normal. Pupils are equal, round, and reactive to light. Right eye exhibits no discharge. Left eye exhibits no discharge. No scleral icterus.  Neck: Normal range of motion. Neck supple. No JVD present. No tracheal deviation present. No thyromegaly present.  Cardiovascular: Normal rate, regular rhythm, normal heart sounds and intact distal pulses.  Exam reveals no gallop and no friction rub.   No murmur heard. Pulmonary/Chest: Breath sounds normal. No respiratory distress. He has no wheezes. He has no rales.  Abdominal: Soft. Bowel sounds are normal. He exhibits no mass. There is no hepatosplenomegaly. There is no tenderness. There is no rebound, no guarding and no CVA tenderness.  Musculoskeletal: Normal range of motion. He exhibits no edema or tenderness.  Lymphadenopathy:    He has no cervical adenopathy.  Neurological: He is alert and oriented to person, place, and time. He has normal sensation, normal strength, normal reflexes and intact cranial nerves. No cranial nerve deficit.  Skin: Skin is warm. No rash noted.  Psychiatric: Mood and affect  normal.  Nursing note and vitals reviewed.     Assessment & Plan  Problem List Items Addressed This Visit      Cardiovascular and Mediastinum   A-fib (HCC)   Relevant Medications   levothyroxine (SYNTHROID, LEVOTHROID) 100 MCG tablet   lovastatin (MEVACOR) 20 MG tablet   diltiazem (DILACOR XR) 240 MG 24 hr capsule   olmesartan-hydrochlorothiazide (BENICAR HCT) 40-25 MG tablet   metoprolol succinate (TOPROL-XL) 50 MG 24 hr tablet   BP (high blood pressure) - Primary   Relevant Medications   lovastatin (MEVACOR) 20 MG tablet   diltiazem (DILACOR XR) 240 MG 24 hr capsule   olmesartan-hydrochlorothiazide (BENICAR HCT) 40-25 MG tablet   metoprolol succinate (TOPROL-XL) 50 MG 24 hr tablet   Benign essential HTN   Relevant Medications   lovastatin (MEVACOR) 20 MG tablet   diltiazem (DILACOR XR) 240 MG 24 hr capsule   olmesartan-hydrochlorothiazide (BENICAR HCT) 40-25 MG tablet   metoprolol succinate (TOPROL-XL) 50 MG 24 hr tablet   Aortic atherosclerosis (HCC)   Relevant Medications   lovastatin (MEVACOR) 20 MG tablet   diltiazem (DILACOR XR) 240 MG 24 hr capsule   olmesartan-hydrochlorothiazide (BENICAR HCT) 40-25 MG tablet   metoprolol succinate (TOPROL-XL) 50 MG 24 hr tablet     Endocrine   Adult hypothyroidism   Relevant Medications   levothyroxine (SYNTHROID, LEVOTHROID) 100 MCG tablet   metoprolol succinate (TOPROL-XL) 50 MG 24 hr tablet  Other   Familial multiple lipoprotein-type hyperlipidemia   Relevant Medications   lovastatin (MEVACOR) 20 MG tablet   diltiazem (DILACOR XR) 240 MG 24 hr capsule   olmesartan-hydrochlorothiazide (BENICAR HCT) 40-25 MG tablet   metoprolol succinate (TOPROL-XL) 50 MG 24 hr tablet   Gout   Relevant Medications   allopurinol (ZYLOPRIM) 100 MG tablet        Dr. Hayden Rasmusseneanna Ezana Hubbert [facility]  01/02/16

## 2016-03-13 ENCOUNTER — Other Ambulatory Visit: Payer: Self-pay

## 2016-03-13 DIAGNOSIS — I10 Essential (primary) hypertension: Secondary | ICD-10-CM

## 2016-03-13 DIAGNOSIS — M1A9XX0 Chronic gout, unspecified, without tophus (tophi): Secondary | ICD-10-CM

## 2016-03-13 DIAGNOSIS — E7849 Other hyperlipidemia: Secondary | ICD-10-CM

## 2016-03-13 DIAGNOSIS — I480 Paroxysmal atrial fibrillation: Secondary | ICD-10-CM

## 2016-03-13 MED ORDER — METOPROLOL SUCCINATE ER 50 MG PO TB24: 50.0000 mg | ORAL_TABLET | Freq: Every day | ORAL | 0 refills | Status: DC

## 2016-03-13 MED ORDER — LOVASTATIN 20 MG PO TABS: 20.0000 mg | ORAL_TABLET | Freq: Every day | ORAL | 0 refills | Status: DC

## 2016-03-13 MED ORDER — DILTIAZEM HCL ER 240 MG PO CP24: 240.0000 mg | ORAL_CAPSULE | Freq: Every day | ORAL | 0 refills | Status: DC

## 2016-03-13 MED ORDER — LEVOTHYROXINE SODIUM 100 MCG PO TABS: 100.0000 ug | ORAL_TABLET | Freq: Every day | ORAL | 0 refills | Status: DC

## 2016-03-13 MED ORDER — ALLOPURINOL 100 MG PO TABS: 100.0000 mg | ORAL_TABLET | Freq: Every day | ORAL | 1 refills | Status: DC

## 2016-03-13 MED ORDER — OLMESARTAN MEDOXOMIL-HCTZ 40-25 MG PO TABS: 1.0000 | ORAL_TABLET | Freq: Every day | ORAL | 0 refills | Status: DC

## 2016-05-21 ENCOUNTER — Other Ambulatory Visit: Payer: Self-pay

## 2016-05-21 DIAGNOSIS — I480 Paroxysmal atrial fibrillation: Secondary | ICD-10-CM

## 2016-05-21 DIAGNOSIS — I10 Essential (primary) hypertension: Secondary | ICD-10-CM

## 2016-05-21 DIAGNOSIS — E7849 Other hyperlipidemia: Secondary | ICD-10-CM

## 2016-05-21 DIAGNOSIS — M1A9XX0 Chronic gout, unspecified, without tophus (tophi): Secondary | ICD-10-CM

## 2016-05-21 MED ORDER — OLMESARTAN MEDOXOMIL-HCTZ 40-25 MG PO TABS: 1.0000 | ORAL_TABLET | Freq: Every day | ORAL | 0 refills | Status: DC

## 2016-05-21 MED ORDER — DILTIAZEM HCL ER 240 MG PO CP24: 240.0000 mg | ORAL_CAPSULE | Freq: Every day | ORAL | 0 refills | Status: DC

## 2016-05-21 MED ORDER — METOPROLOL SUCCINATE ER 50 MG PO TB24: 50.0000 mg | ORAL_TABLET | Freq: Every day | ORAL | 0 refills | Status: DC

## 2016-05-21 MED ORDER — LOVASTATIN 20 MG PO TABS: 20.0000 mg | ORAL_TABLET | Freq: Every day | ORAL | 0 refills | Status: DC

## 2016-05-21 MED ORDER — ALLOPURINOL 100 MG PO TABS: 100.0000 mg | ORAL_TABLET | Freq: Every day | ORAL | 1 refills | Status: DC

## 2016-05-21 MED ORDER — LEVOTHYROXINE SODIUM 100 MCG PO TABS: 100.0000 ug | ORAL_TABLET | Freq: Every day | ORAL | 0 refills | Status: DC

## 2016-07-02 ENCOUNTER — Ambulatory Visit (INDEPENDENT_AMBULATORY_CARE_PROVIDER_SITE_OTHER): Payer: Managed Care, Other (non HMO) | Admitting: Family Medicine

## 2016-07-02 ENCOUNTER — Other Ambulatory Visit
Admission: RE | Admit: 2016-07-02 | Discharge: 2016-07-02 | Disposition: A | Discharge status: Home / Self Care | Payer: Managed Care, Other (non HMO) | Source: Ambulatory Visit | Attending: Family Medicine | Admitting: Family Medicine

## 2016-07-02 ENCOUNTER — Encounter: Payer: Self-pay | Admitting: Family Medicine

## 2016-07-02 VITALS — BP 120/72 | HR 64 | Ht 72.0 in | Wt 211.0 lb

## 2016-07-02 DIAGNOSIS — I10 Essential (primary) hypertension: Secondary | ICD-10-CM | POA: Diagnosis not present

## 2016-07-02 DIAGNOSIS — E784 Other hyperlipidemia: Secondary | ICD-10-CM | POA: Insufficient documentation

## 2016-07-02 DIAGNOSIS — M1A9XX0 Chronic gout, unspecified, without tophus (tophi): Secondary | ICD-10-CM | POA: Diagnosis not present

## 2016-07-02 DIAGNOSIS — E7849 Other hyperlipidemia: Secondary | ICD-10-CM

## 2016-07-02 DIAGNOSIS — I480 Paroxysmal atrial fibrillation: Secondary | ICD-10-CM

## 2016-07-02 LAB — RENAL FUNCTION PANEL
Albumin: 4.5 g/dL (ref 3.5–5.0)
Anion gap: 8 (ref 5–15)
BUN: 31 mg/dL — ABNORMAL HIGH (ref 6–20)
CHLORIDE: 103 mmol/L (ref 101–111)
CO2: 25 mmol/L (ref 22–32)
Calcium: 9.3 mg/dL (ref 8.9–10.3)
Creatinine, Ser: 1.86 mg/dL — ABNORMAL HIGH (ref 0.61–1.24)
GFR calc non Af Amer: 35 mL/min — ABNORMAL LOW (ref >60)
GFR, EST AFRICAN AMERICAN: 40 mL/min — AB (ref >60)
GLUCOSE: 113 mg/dL — AB (ref 65–99)
Phosphorus: 3.4 mg/dL (ref 2.5–4.6)
Potassium: 4.3 mmol/L (ref 3.5–5.1)
Sodium: 136 mmol/L (ref 135–145)

## 2016-07-02 LAB — CBC
HCT: 44.8 % (ref 40.0–52.0)
Hemoglobin: 14.6 g/dL (ref 13.0–18.0)
MCH: 27.3 pg (ref 26.0–34.0)
MCHC: 32.6 g/dL (ref 32.0–36.0)
MCV: 83.8 fL (ref 80.0–100.0)
PLATELETS: 197 10*3/uL (ref 150–440)
RBC: 5.35 MIL/uL (ref 4.40–5.90)
RDW: 14.7 % — ABNORMAL HIGH (ref 11.5–14.5)
WBC: 8.1 10*3/uL (ref 3.8–10.6)

## 2016-07-02 LAB — TSH: TSH: 2.084 u[IU]/mL (ref 0.350–4.500)

## 2016-07-02 LAB — LIPID PANEL
Cholesterol: 123 mg/dL (ref 0–200)
HDL: 30 mg/dL — AB (ref >40)
LDL CALC: 53 mg/dL (ref 0–99)
TRIGLYCERIDES: 199 mg/dL — AB (ref <150)
Total CHOL/HDL Ratio: 4.1 RATIO
VLDL: 40 mg/dL (ref 0–40)

## 2016-07-02 MED ORDER — OLMESARTAN MEDOXOMIL-HCTZ 40-25 MG PO TABS: 1.0000 | ORAL_TABLET | Freq: Every day | ORAL | 1 refills | Status: DC

## 2016-07-02 MED ORDER — DILTIAZEM HCL ER 240 MG PO CP24: 240.0000 mg | ORAL_CAPSULE | Freq: Every day | ORAL | 1 refills | Status: DC

## 2016-07-02 MED ORDER — METOPROLOL SUCCINATE ER 50 MG PO TB24: 50.0000 mg | ORAL_TABLET | Freq: Every day | ORAL | 1 refills | Status: DC

## 2016-07-02 MED ORDER — ASPIRIN 81 MG PO TABS: 81.0000 mg | ORAL_TABLET | Freq: Every day | ORAL | 11 refills | Status: DC

## 2016-07-02 MED ORDER — LOVASTATIN 20 MG PO TABS: 20.0000 mg | ORAL_TABLET | Freq: Every day | ORAL | 1 refills | Status: DC

## 2016-07-02 MED ORDER — ALLOPURINOL 100 MG PO TABS: 100.0000 mg | ORAL_TABLET | Freq: Every day | ORAL | 1 refills | Status: DC

## 2016-07-02 MED ORDER — LEVOTHYROXINE SODIUM 100 MCG PO TABS: 100.0000 ug | ORAL_TABLET | Freq: Every day | ORAL | 1 refills | Status: DC

## 2016-07-02 NOTE — Progress Notes (Signed)
Name: [name]   MRN: [id number]    DOB: 12/25/1945   Date:07/02/2016       Progress Note  Subjective  Chief Complaint  Chief Complaint  Patient presents with  . Hyperlipidemia  . Hypertension  . Gout  . Hypothyroidism    Patient presents for medication refill.  In addition to below diagnoses, patient has hyperuricacidemia.   Hyperlipidemia  This is a chronic problem. The current episode started more than 1 year ago. The problem is controlled. Recent lipid tests were reviewed and are normal. He has no history of chronic renal disease, diabetes, hypothyroidism, liver disease, obesity or nephrotic syndrome. Factors aggravating his hyperlipidemia include thiazides. Pertinent negatives include no chest pain, focal sensory loss, focal weakness, leg pain, myalgias or shortness of breath. He is currently on no antihyperlipidemic treatment. The current treatment provides no improvement of lipids. There are no compliance problems.  Risk factors for coronary artery disease include hypertension and dyslipidemia.  Hypertension  This is a chronic problem. The current episode started more than 1 year ago. The problem has been gradually improving since onset. The problem is controlled. Pertinent negatives include no anxiety, blurred vision, chest pain, headaches, malaise/fatigue, neck pain, orthopnea, palpitations, peripheral edema, PND, shortness of breath or sweats. There are no associated agents to hypertension. There are no known risk factors for coronary artery disease. Past treatments include angiotensin blockers, calcium channel blockers, beta blockers and diuretics. The current treatment provides no improvement. There are no compliance problems.  There is no history of angina, kidney disease, CAD/MI, CVA, heart failure, left ventricular hypertrophy, PVD or retinopathy. Identifiable causes of hypertension include a thyroid problem. There is no history of chronic renal disease, a hypertension causing  med or renovascular disease.  Thyroid Problem  Presents for follow-up visit. Patient reports no anxiety, cold intolerance, constipation, depressed mood, diaphoresis, diarrhea, dry skin, fatigue, hair loss, heat intolerance, hoarse voice, leg swelling, nail problem, palpitations, tremors, visual change, weight gain or weight loss. The symptoms have been stable. His past medical history is significant for hyperlipidemia. There is no history of diabetes or heart failure.    No problem-specific Assessment & Plan notes found for this encounter.   Past Medical History:  Diagnosis Date  . Gout   . Hyperlipidemia   . Hypertension   . Thyroid disease     Past Surgical History:  Procedure Laterality Date  . APPENDECTOMY    . CARDIAC SURGERY     quad. bypass  . peptic ulcer     removed  . TONSILLECTOMY      No family history on file.  Social History   Social History  . Marital status: Married    Spouse name: N/A  . Number of children: N/A  . Years of education: N/A   Occupational History  . Not on file.   Social History Main Topics  . Smoking status: Never Smoker  . Smokeless tobacco: Current User    Types: Chew  . Alcohol use 0.0 oz/week  . Drug use: No  . Sexual activity: Not on file   Other Topics Concern  . Not on file   Social History Narrative  . No narrative on file    No Known Allergies  Outpatient Medications Prior to Visit  Medication Sig Dispense Refill  . allopurinol (ZYLOPRIM) 100 MG tablet Take 1 tablet (100 mg total) by mouth daily. 90 tablet 1  . aspirin 81 MG tablet Take 1 tablet by mouth daily.    Marland Kitchen  diltiazem (DILACOR XR) 240 MG 24 hr capsule Take 1 capsule (240 mg total) by mouth daily. 90 capsule 0  . levothyroxine (SYNTHROID, LEVOTHROID) 100 MCG tablet Take 1 tablet (100 mcg total) by mouth daily. 90 tablet 0  . lovastatin (MEVACOR) 20 MG tablet Take 1 tablet (20 mg total) by mouth daily. 90 tablet 0  . metoprolol succinate (TOPROL-XL) 50 MG 24  hr tablet Take 1 tablet (50 mg total) by mouth daily. 90 tablet 0  . olmesartan-hydrochlorothiazide (BENICAR HCT) 40-25 MG tablet Take 1 tablet by mouth daily. 90 tablet 0   No facility-administered medications prior to visit.     Review of Systems  Constitutional: Negative for chills, diaphoresis, fatigue, fever, malaise/fatigue, weight gain and weight loss.  HENT: Negative for ear discharge, ear pain, hoarse voice and sore throat.   Eyes: Negative for blurred vision.  Respiratory: Negative for cough, sputum production, shortness of breath and wheezing.   Cardiovascular: Negative for chest pain, palpitations, orthopnea, leg swelling and PND.  Gastrointestinal: Negative for abdominal pain, blood in stool, constipation, diarrhea, heartburn, melena and nausea.  Genitourinary: Negative for dysuria, frequency, hematuria and urgency.  Musculoskeletal: Negative for back pain, joint pain, myalgias and neck pain.  Skin: Negative for rash.  Neurological: Negative for dizziness, tingling, tremors, sensory change, focal weakness and headaches.  Endo/Heme/Allergies: Negative for environmental allergies, cold intolerance, heat intolerance and polydipsia. Does not bruise/bleed easily.  Psychiatric/Behavioral: Negative for depression and suicidal ideas. The patient is not nervous/anxious and does not have insomnia.      Objective  Vitals:   07/02/16 0920  BP: 120/72  Pulse: 64  Weight: 211 lb (95.7 kg)  Height: 6' (1.829 m)    Physical Exam  Constitutional: He is oriented to person, place, and time and well-developed, well-nourished, and in no distress.  HENT:  Head: Normocephalic.  Right Ear: External ear normal.  Left Ear: External ear normal.  Nose: Nose normal.  Mouth/Throat: Oropharynx is clear and moist.  Eyes: Conjunctivae and EOM are normal. Pupils are equal, round, and reactive to light. Right eye exhibits no discharge. Left eye exhibits no discharge. No scleral icterus.  Neck:  Normal range of motion. Neck supple. No JVD present. No tracheal deviation present. No thyromegaly present.  Cardiovascular: Normal rate, regular rhythm, normal heart sounds and intact distal pulses.  Exam reveals no gallop and no friction rub.   No murmur heard. Pulmonary/Chest: Breath sounds normal. No respiratory distress. He has no wheezes. He has no rales.  Abdominal: Soft. Bowel sounds are normal. He exhibits no mass. There is no hepatosplenomegaly. There is no tenderness. There is no rebound, no guarding and no CVA tenderness.  Musculoskeletal: Normal range of motion. He exhibits no edema or tenderness.  Lymphadenopathy:    He has no cervical adenopathy.  Neurological: He is alert and oriented to person, place, and time. He has normal sensation, normal strength, normal reflexes and intact cranial nerves. No cranial nerve deficit.  Skin: Skin is warm. No rash noted.  Psychiatric: Mood and affect normal.  Nursing note and vitals reviewed.     Assessment & Plan  Problem List Items Addressed This Visit      Cardiovascular and Mediastinum   A-fib (HCC)   Relevant Medications   levothyroxine (SYNTHROID, LEVOTHROID) 100 MCG tablet   lovastatin (MEVACOR) 20 MG tablet   diltiazem (DILACOR XR) 240 MG 24 hr capsule   metoprolol succinate (TOPROL-XL) 50 MG 24 hr tablet   olmesartan-hydrochlorothiazide (BENICAR HCT) 40-25 MG  tablet   aspirin 81 MG tablet   BP (high blood pressure) - Primary   Relevant Medications   lovastatin (MEVACOR) 20 MG tablet   diltiazem (DILACOR XR) 240 MG 24 hr capsule   metoprolol succinate (TOPROL-XL) 50 MG 24 hr tablet   olmesartan-hydrochlorothiazide (BENICAR HCT) 40-25 MG tablet   aspirin 81 MG tablet   Other Relevant Orders   Renal Function Panel   CBC   Benign essential HTN   Relevant Medications   lovastatin (MEVACOR) 20 MG tablet   diltiazem (DILACOR XR) 240 MG 24 hr capsule   metoprolol succinate (TOPROL-XL) 50 MG 24 hr tablet    olmesartan-hydrochlorothiazide (BENICAR HCT) 40-25 MG tablet   aspirin 81 MG tablet   Other Relevant Orders   Renal Function Panel   CBC   TSH     Other   Familial multiple lipoprotein-type hyperlipidemia   Relevant Medications   lovastatin (MEVACOR) 20 MG tablet   diltiazem (DILACOR XR) 240 MG 24 hr capsule   metoprolol succinate (TOPROL-XL) 50 MG 24 hr tablet   olmesartan-hydrochlorothiazide (BENICAR HCT) 40-25 MG tablet   aspirin 81 MG tablet   Other Relevant Orders   Lipid Profile   Gout   Relevant Medications   allopurinol (ZYLOPRIM) 100 MG tablet   Other Relevant Orders   Renal Function Panel   CBC      Meds ordered this encounter  Medications  . levothyroxine (SYNTHROID, LEVOTHROID) 100 MCG tablet    Sig: Take 1 tablet (100 mcg total) by mouth daily.    Dispense:  90 tablet    Refill:  1  . lovastatin (MEVACOR) 20 MG tablet    Sig: Take 1 tablet (20 mg total) by mouth daily.    Dispense:  90 tablet    Refill:  1  . diltiazem (DILACOR XR) 240 MG 24 hr capsule    Sig: Take 1 capsule (240 mg total) by mouth daily.    Dispense:  90 capsule    Refill:  1  . allopurinol (ZYLOPRIM) 100 MG tablet    Sig: Take 1 tablet (100 mg total) by mouth daily.    Dispense:  90 tablet    Refill:  1  . metoprolol succinate (TOPROL-XL) 50 MG 24 hr tablet    Sig: Take 1 tablet (50 mg total) by mouth daily.    Dispense:  90 tablet    Refill:  1  . olmesartan-hydrochlorothiazide (BENICAR HCT) 40-25 MG tablet    Sig: Take 1 tablet by mouth daily.    Dispense:  90 tablet    Refill:  1  . aspirin 81 MG tablet    Sig: Take 1 tablet (81 mg total) by mouth daily.    Dispense:  30 tablet    Refill:  11      Dr. Hayden Rasmusseneanna Jones [facility]  07/02/16

## 2016-07-03 ENCOUNTER — Other Ambulatory Visit: Payer: Self-pay

## 2016-12-02 ENCOUNTER — Other Ambulatory Visit: Payer: Self-pay | Admitting: Family Medicine

## 2016-12-02 DIAGNOSIS — M1A9XX0 Chronic gout, unspecified, without tophus (tophi): Secondary | ICD-10-CM

## 2017-01-03 ENCOUNTER — Encounter: Payer: Self-pay | Admitting: Family Medicine

## 2017-01-03 ENCOUNTER — Ambulatory Visit: Payer: Managed Care, Other (non HMO) | Admitting: Family Medicine

## 2017-01-03 VITALS — BP 130/62 | HR 60 | Ht 71.0 in | Wt 218.0 lb

## 2017-01-03 DIAGNOSIS — E7849 Other hyperlipidemia: Secondary | ICD-10-CM | POA: Diagnosis not present

## 2017-01-03 DIAGNOSIS — E039 Hypothyroidism, unspecified: Secondary | ICD-10-CM | POA: Diagnosis not present

## 2017-01-03 DIAGNOSIS — I10 Essential (primary) hypertension: Secondary | ICD-10-CM

## 2017-01-03 DIAGNOSIS — I480 Paroxysmal atrial fibrillation: Secondary | ICD-10-CM

## 2017-01-03 DIAGNOSIS — M1A9XX0 Chronic gout, unspecified, without tophus (tophi): Secondary | ICD-10-CM | POA: Diagnosis not present

## 2017-01-03 MED ORDER — METOPROLOL SUCCINATE ER 50 MG PO TB24: 50.0000 mg | ORAL_TABLET | Freq: Every day | ORAL | 1 refills | Status: DC

## 2017-01-03 MED ORDER — LEVOTHYROXINE SODIUM 100 MCG PO TABS: 100.0000 ug | ORAL_TABLET | Freq: Every day | ORAL | 1 refills | Status: DC

## 2017-01-03 MED ORDER — DILTIAZEM HCL ER 240 MG PO CP24: 240.0000 mg | ORAL_CAPSULE | Freq: Every day | ORAL | 1 refills | Status: DC

## 2017-01-03 MED ORDER — OLMESARTAN MEDOXOMIL-HCTZ 40-25 MG PO TABS
1.0000 | ORAL_TABLET | Freq: Every day | ORAL | 1 refills | Status: DC
Start: 2017-01-03 — End: 2017-07-03

## 2017-01-03 MED ORDER — LOVASTATIN 20 MG PO TABS: 20.0000 mg | ORAL_TABLET | Freq: Every day | ORAL | 1 refills | Status: DC

## 2017-01-03 MED ORDER — ALLOPURINOL 100 MG PO TABS: 100.0000 mg | ORAL_TABLET | Freq: Every day | ORAL | 1 refills | Status: DC

## 2017-01-03 NOTE — Progress Notes (Signed)
Name: [name]   MRN: [id number]    DOB: Dec 22, 1945   Date:01/03/2017       Progress Note  Subjective  Chief Complaint  Chief Complaint  Patient presents with  . Gout  . Hypertension  . Hyperlipidemia  . Hypothyroidism    Patient presents for medication refill and gout prevention.   Hypertension  This is a chronic problem. The current episode started more than 1 year ago. The problem is unchanged. The problem is controlled. Pertinent negatives include no anxiety, blurred vision, chest pain, headaches, malaise/fatigue, neck pain, orthopnea, palpitations, peripheral edema, PND, shortness of breath or sweats. There are no associated agents to hypertension. Risk factors for coronary artery disease include diabetes mellitus and dyslipidemia. Past treatments include angiotensin blockers, beta blockers, calcium channel blockers and diuretics. The current treatment provides moderate improvement. There is no history of angina, kidney disease, CAD/MI, CVA, heart failure, left ventricular hypertrophy, PVD or retinopathy. Identifiable causes of hypertension include a thyroid problem. There is no history of chronic renal disease, a hypertension causing med or renovascular disease.  Hyperlipidemia  This is a chronic problem. The current episode started more than 1 year ago. The problem is controlled. Recent lipid tests were reviewed and are normal. Exacerbating diseases include diabetes. He has no history of chronic renal disease, hypothyroidism, liver disease, obesity or nephrotic syndrome. Factors aggravating his hyperlipidemia include thiazides. Pertinent negatives include no chest pain, focal weakness, myalgias or shortness of breath. The current treatment provides moderate improvement of lipids. There are no compliance problems.   Thyroid Problem  Presents for follow-up visit. Patient reports no anxiety, cold intolerance, constipation, depressed mood, diaphoresis, diarrhea, fatigue, hair loss,  heat intolerance, hoarse voice, leg swelling, nail problem, palpitations, tremors, visual change, weight gain or weight loss. The symptoms have been stable. His past medical history is significant for diabetes and hyperlipidemia. There is no history of heart failure.    No problem-specific Assessment & Plan notes found for this encounter.   Past Medical History:  Diagnosis Date  . Gout   . Hyperlipidemia   . Hypertension   . Thyroid disease     Past Surgical History:  Procedure Laterality Date  . APPENDECTOMY    . CARDIAC SURGERY     quad. bypass  . peptic ulcer     removed  . TONSILLECTOMY      History reviewed. No pertinent family history.  Social History   Socioeconomic History  . Marital status: Married    Spouse name: Not on file  . Number of children: Not on file  . Years of education: Not on file  . Highest education level: Not on file  Social Needs  . Financial resource strain: Not on file  . Food insecurity - worry: Not on file  . Food insecurity - inability: Not on file  . Transportation needs - medical: Not on file  . Transportation needs - non-medical: Not on file  Occupational History  . Not on file  Tobacco Use  . Smoking status: Never Smoker  . Smokeless tobacco: Current User    Types: Chew  Substance and Sexual Activity  . Alcohol use: Yes    Alcohol/week: 0.0 oz  . Drug use: No  . Sexual activity: Not on file  Other Topics Concern  . Not on file  Social History Narrative  . Not on file    No Known Allergies  Outpatient Medications Prior to Visit  Medication Sig Dispense Refill  . aspirin 81  MG tablet Take 1 tablet (81 mg total) by mouth daily. 30 tablet 11  . allopurinol (ZYLOPRIM) 100 MG tablet Take 1 tablet (100 mg total) by mouth daily. 90 tablet 1  . diltiazem (DILACOR XR) 240 MG 24 hr capsule Take 1 capsule (240 mg total) by mouth daily. 90 capsule 1  . levothyroxine (SYNTHROID, LEVOTHROID) 100 MCG tablet Take 1 tablet (100 mcg  total) by mouth daily. 90 tablet 1  . lovastatin (MEVACOR) 20 MG tablet Take 1 tablet (20 mg total) by mouth daily. 90 tablet 1  . metoprolol succinate (TOPROL-XL) 50 MG 24 hr tablet Take 1 tablet (50 mg total) by mouth daily. 90 tablet 1  . olmesartan-hydrochlorothiazide (BENICAR HCT) 40-25 MG tablet Take 1 tablet by mouth daily. 90 tablet 1  . allopurinol (ZYLOPRIM) 100 MG tablet TAKE 1 TABLET(100 MG) BY MOUTH DAILY 90 tablet 0   No facility-administered medications prior to visit.     Review of Systems  Constitutional: Negative for chills, diaphoresis, fatigue, fever, malaise/fatigue, weight gain and weight loss.  HENT: Negative for ear discharge, ear pain, hoarse voice and sore throat.   Eyes: Negative for blurred vision.  Respiratory: Negative for cough, sputum production, shortness of breath and wheezing.   Cardiovascular: Negative for chest pain, palpitations, orthopnea, leg swelling and PND.  Gastrointestinal: Negative for abdominal pain, blood in stool, constipation, diarrhea, heartburn, melena and nausea.  Genitourinary: Negative for dysuria, frequency, hematuria and urgency.  Musculoskeletal: Negative for back pain, joint pain, myalgias and neck pain.  Skin: Negative for rash.  Neurological: Negative for dizziness, tingling, tremors, sensory change, focal weakness and headaches.  Endo/Heme/Allergies: Negative for environmental allergies, cold intolerance, heat intolerance and polydipsia. Does not bruise/bleed easily.  Psychiatric/Behavioral: Negative for depression and suicidal ideas. The patient is not nervous/anxious and does not have insomnia.      Objective  Vitals:   01/03/17 0948  BP: 130/62  Pulse: 60  Weight: 218 lb (98.9 kg)  Height: 5\' 11"  (1.803 m)    Physical Exam  Constitutional: He is oriented to person, place, and time and well-developed, well-nourished, and in no distress.  HENT:  Head: Normocephalic.  Right Ear: External ear normal.  Left Ear:  External ear normal.  Nose: Nose normal.  Mouth/Throat: Oropharynx is clear and moist.  Eyes: Conjunctivae and EOM are normal. Pupils are equal, round, and reactive to light. Right eye exhibits no discharge. Left eye exhibits no discharge. No scleral icterus.  Neck: Normal range of motion. Neck supple. No JVD present. No tracheal deviation present. No thyromegaly present.  Cardiovascular: Normal rate, regular rhythm, normal heart sounds and intact distal pulses. Exam reveals no gallop and no friction rub.  No murmur heard. Pulmonary/Chest: Breath sounds normal. No respiratory distress. He has no wheezes. He has no rales.  Abdominal: Soft. Bowel sounds are normal. He exhibits no mass. There is no hepatosplenomegaly. There is no tenderness. There is no rebound, no guarding and no CVA tenderness.  Musculoskeletal: Normal range of motion. He exhibits no edema or tenderness.  Lymphadenopathy:    He has no cervical adenopathy.  Neurological: He is alert and oriented to person, place, and time. He has normal sensation, normal strength and intact cranial nerves. No cranial nerve deficit.  Skin: Skin is warm. No rash noted.  Psychiatric: Mood and affect normal.  Nursing note and vitals reviewed.     Assessment & Plan  Problem List Items Addressed This Visit      Cardiovascular and Mediastinum  A-fib (HCC)   Relevant Medications   olmesartan-hydrochlorothiazide (BENICAR HCT) 40-25 MG tablet   metoprolol succinate (TOPROL-XL) 50 MG 24 hr tablet   diltiazem (DILACOR XR) 240 MG 24 hr capsule   lovastatin (MEVACOR) 20 MG tablet   levothyroxine (SYNTHROID, LEVOTHROID) 100 MCG tablet   BP (high blood pressure) - Primary   Relevant Medications   olmesartan-hydrochlorothiazide (BENICAR HCT) 40-25 MG tablet   metoprolol succinate (TOPROL-XL) 50 MG 24 hr tablet   diltiazem (DILACOR XR) 240 MG 24 hr capsule   lovastatin (MEVACOR) 20 MG tablet   Benign essential HTN   Relevant Medications    olmesartan-hydrochlorothiazide (BENICAR HCT) 40-25 MG tablet   metoprolol succinate (TOPROL-XL) 50 MG 24 hr tablet   diltiazem (DILACOR XR) 240 MG 24 hr capsule   lovastatin (MEVACOR) 20 MG tablet     Endocrine   Adult hypothyroidism   Relevant Medications   metoprolol succinate (TOPROL-XL) 50 MG 24 hr tablet   levothyroxine (SYNTHROID, LEVOTHROID) 100 MCG tablet     Other   Familial multiple lipoprotein-type hyperlipidemia   Relevant Medications   olmesartan-hydrochlorothiazide (BENICAR HCT) 40-25 MG tablet   metoprolol succinate (TOPROL-XL) 50 MG 24 hr tablet   diltiazem (DILACOR XR) 240 MG 24 hr capsule   lovastatin (MEVACOR) 20 MG tablet   Gout   Relevant Medications   allopurinol (ZYLOPRIM) 100 MG tablet      Meds ordered this encounter  Medications  . olmesartan-hydrochlorothiazide (BENICAR HCT) 40-25 MG tablet    Sig: Take 1 tablet daily by mouth.    Dispense:  90 tablet    Refill:  1  . metoprolol succinate (TOPROL-XL) 50 MG 24 hr tablet    Sig: Take 1 tablet (50 mg total) daily by mouth.    Dispense:  90 tablet    Refill:  1  . diltiazem (DILACOR XR) 240 MG 24 hr capsule    Sig: Take 1 capsule (240 mg total) daily by mouth.    Dispense:  90 capsule    Refill:  1  . allopurinol (ZYLOPRIM) 100 MG tablet    Sig: Take 1 tablet (100 mg total) daily by mouth.    Dispense:  90 tablet    Refill:  1  . lovastatin (MEVACOR) 20 MG tablet    Sig: Take 1 tablet (20 mg total) daily by mouth.    Dispense:  90 tablet    Refill:  1  . levothyroxine (SYNTHROID, LEVOTHROID) 100 MCG tablet    Sig: Take 1 tablet (100 mcg total) daily by mouth.    Dispense:  90 tablet    Refill:  1      Dr. Elizabeth Sauer [facility]  01/03/17

## 2017-07-03 ENCOUNTER — Ambulatory Visit: Payer: Managed Care, Other (non HMO) | Admitting: Family Medicine

## 2017-07-03 ENCOUNTER — Encounter: Payer: Self-pay | Admitting: Family Medicine

## 2017-07-03 VITALS — BP 130/80 | HR 72 | Ht 71.0 in | Wt 215.0 lb

## 2017-07-03 DIAGNOSIS — E7849 Other hyperlipidemia: Secondary | ICD-10-CM | POA: Diagnosis not present

## 2017-07-03 DIAGNOSIS — E039 Hypothyroidism, unspecified: Secondary | ICD-10-CM

## 2017-07-03 DIAGNOSIS — I480 Paroxysmal atrial fibrillation: Secondary | ICD-10-CM | POA: Diagnosis not present

## 2017-07-03 DIAGNOSIS — I10 Essential (primary) hypertension: Secondary | ICD-10-CM

## 2017-07-03 DIAGNOSIS — I25810 Atherosclerosis of coronary artery bypass graft(s) without angina pectoris: Secondary | ICD-10-CM | POA: Diagnosis not present

## 2017-07-03 DIAGNOSIS — M1A9XX0 Chronic gout, unspecified, without tophus (tophi): Secondary | ICD-10-CM

## 2017-07-03 DIAGNOSIS — I700 Atherosclerosis of aorta: Secondary | ICD-10-CM

## 2017-07-03 MED ORDER — LEVOTHYROXINE SODIUM 100 MCG PO TABS: 100.0000 ug | ORAL_TABLET | Freq: Every day | ORAL | 1 refills | Status: DC

## 2017-07-03 MED ORDER — OLMESARTAN MEDOXOMIL-HCTZ 40-25 MG PO TABS: 1.0000 | ORAL_TABLET | Freq: Every day | ORAL | 1 refills | Status: DC

## 2017-07-03 MED ORDER — LOVASTATIN 20 MG PO TABS: 20.0000 mg | ORAL_TABLET | Freq: Every day | ORAL | 1 refills | Status: DC

## 2017-07-03 MED ORDER — DILTIAZEM HCL ER 240 MG PO CP24: 240.0000 mg | ORAL_CAPSULE | Freq: Every day | ORAL | 1 refills | Status: DC

## 2017-07-03 MED ORDER — METOPROLOL SUCCINATE ER 50 MG PO TB24: 50.0000 mg | ORAL_TABLET | Freq: Every day | ORAL | 1 refills | Status: DC

## 2017-07-03 MED ORDER — ALLOPURINOL 100 MG PO TABS: 100.0000 mg | ORAL_TABLET | Freq: Every day | ORAL | 1 refills | Status: DC

## 2017-07-03 MED ORDER — ASPIRIN 81 MG PO TABS: 81.0000 mg | ORAL_TABLET | Freq: Every day | ORAL | 11 refills | Status: DC

## 2017-07-03 NOTE — Progress Notes (Signed)
Name: [name]   MRN: [id number]    DOB: [birth year]   Date:07/03/2017       Progress Note  Subjective  Chief Complaint  Chief Complaint  Patient presents with  . Hyperlipidemia  . Hypertension  . Hypothyroidism  . Gout    Hyperlipidemia  This is a chronic problem. The current episode started more than 1 year ago. The problem is controlled. Recent lipid tests were reviewed and are normal. He has no history of chronic renal disease, diabetes, hypothyroidism, liver disease, obesity or nephrotic syndrome. Pertinent negatives include no chest pain, focal sensory loss, focal weakness, leg pain, myalgias or shortness of breath. He is currently on no antihyperlipidemic treatment. The current treatment provides moderate improvement of lipids. There are no compliance problems.  Risk factors for coronary artery disease include hypertension, male sex and dyslipidemia.  Hypertension  This is a chronic problem. The current episode started more than 1 year ago. The problem has been waxing and waning since onset. The problem is controlled. Pertinent negatives include no anxiety, blurred vision, chest pain, headaches, malaise/fatigue, neck pain, orthopnea, palpitations, peripheral edema, PND, shortness of breath or sweats. There are no associated agents to hypertension. There are no known risk factors for coronary artery disease. Past treatments include angiotensin blockers, calcium channel blockers, beta blockers and diuretics. The current treatment provides moderate improvement. There are no compliance problems.  Hypertensive end-organ damage includes CAD/MI. There is no history of angina, kidney disease, CVA, heart failure, left ventricular hypertrophy, PVD or retinopathy. Identifiable causes of hypertension include a thyroid problem. There is no history of chronic renal disease, a hypertension causing med or renovascular disease.  Heart Problem  This is a chronic problem. The current episode started more  than 1 year ago. The problem has been gradually improving. Associated symptoms include fatigue. Pertinent negatives include no abdominal pain, chest pain, chills, congestion, coughing, diaphoresis, fever, headaches, myalgias, nausea, neck pain, rash, sore throat or visual change. Treatments tried: 4 vessel bypass 2000/ The treatment provided moderate relief.  Thyroid Problem  Presents for follow-up visit. Symptoms include fatigue. Patient reports no anxiety, cold intolerance, constipation, depressed mood, diaphoresis, diarrhea, dry skin, hair loss, heat intolerance, hoarse voice, leg swelling, menstrual problem, nail problem, palpitations, tremors, visual change, weight gain or weight loss. The symptoms have been stable. His past medical history is significant for hyperlipidemia. There is no history of diabetes or heart failure.    No problem-specific Assessment & Plan notes found for this encounter.   Past Medical History:  Diagnosis Date  . Gout   . Hyperlipidemia   . Hypertension   . Thyroid disease     Past Surgical History:  Procedure Laterality Date  . APPENDECTOMY    . CARDIAC SURGERY     quad. bypass  . peptic ulcer     removed  . TONSILLECTOMY      History reviewed. No pertinent family history.  Social History   Socioeconomic History  . Marital status: Married    Spouse name: Not on file  . Number of children: Not on file  . Years of education: Not on file  . Highest education level: Not on file  Occupational History  . Not on file  Social Needs  . Financial resource strain: Not on file  . Food insecurity:    Worry: Not on file    Inability: Not on file  . Transportation needs:    Medical: Not on file    Non-medical: Not on  file  Tobacco Use  . Smoking status: Never Smoker  . Smokeless tobacco: Current User    Types: Chew  Substance and Sexual Activity  . Alcohol use: Yes    Alcohol/week: 0.0 oz  . Drug use: No  . Sexual activity: Not on file  Lifestyle   . Physical activity:    Days per week: Not on file    Minutes per session: Not on file  . Stress: Not on file  Relationships  . Social connections:    Talks on phone: Not on file    Gets together: Not on file    Attends religious service: Not on file    Active member of club or organization: Not on file    Attends meetings of clubs or organizations: Not on file    Relationship status: Not on file  . Intimate partner violence:    Fear of current or ex partner: Not on file    Emotionally abused: Not on file    Physically abused: Not on file    Forced sexual activity: Not on file  Other Topics Concern  . Not on file  Social History Narrative  . Not on file    No Known Allergies  Outpatient Medications Prior to Visit  Medication Sig Dispense Refill  . allopurinol (ZYLOPRIM) 100 MG tablet Take 1 tablet (100 mg total) daily by mouth. 90 tablet 1  . aspirin 81 MG tablet Take 1 tablet (81 mg total) by mouth daily. 30 tablet 11  . diltiazem (DILACOR XR) 240 MG 24 hr capsule Take 1 capsule (240 mg total) daily by mouth. 90 capsule 1  . levothyroxine (SYNTHROID, LEVOTHROID) 100 MCG tablet Take 1 tablet (100 mcg total) daily by mouth. 90 tablet 1  . lovastatin (MEVACOR) 20 MG tablet Take 1 tablet (20 mg total) daily by mouth. 90 tablet 1  . metoprolol succinate (TOPROL-XL) 50 MG 24 hr tablet Take 1 tablet (50 mg total) daily by mouth. 90 tablet 1  . olmesartan-hydrochlorothiazide (BENICAR HCT) 40-25 MG tablet Take 1 tablet daily by mouth. 90 tablet 1   No facility-administered medications prior to visit.     Review of Systems  Constitutional: Positive for fatigue. Negative for chills, diaphoresis, fever, malaise/fatigue, weight gain and weight loss.  HENT: Negative for congestion, ear discharge, ear pain, hoarse voice and sore throat.   Eyes: Negative for blurred vision.  Respiratory: Negative for cough, sputum production, shortness of breath and wheezing.   Cardiovascular: Negative  for chest pain, palpitations, orthopnea, leg swelling and PND.  Gastrointestinal: Negative for abdominal pain, blood in stool, constipation, diarrhea, heartburn, melena and nausea.  Genitourinary: Negative for dysuria, frequency, hematuria, menstrual problem and urgency.  Musculoskeletal: Negative for back pain, joint pain, myalgias and neck pain.  Skin: Negative for rash.  Neurological: Negative for dizziness, tingling, tremors, sensory change, focal weakness and headaches.  Endo/Heme/Allergies: Negative for environmental allergies, cold intolerance, heat intolerance and polydipsia. Does not bruise/bleed easily.  Psychiatric/Behavioral: Negative for depression and suicidal ideas. The patient is not nervous/anxious and does not have insomnia.      Objective  Vitals:   07/03/17 0848  BP: 130/80  Pulse: 72  Weight: 215 lb (97.5 kg)  Height:  (1.803 m)    Physical Exam  Constitutional: He is oriented to person, place, and time.  HENT:  Head: Normocephalic.  Right Ear: External ear normal.  Left Ear: External ear normal.  Nose: Nose normal.  Mouth/Throat: Oropharynx is clear and moist.  Eyes: Pupils are equal, round, and reactive to light. Conjunctivae and EOM are normal. Right eye exhibits no discharge. Left eye exhibits no discharge. No scleral icterus.  Neck: Normal range of motion. Neck supple. No JVD present. No tracheal deviation present. No thyromegaly present.  Cardiovascular: Normal rate, regular rhythm, normal heart sounds and intact distal pulses. Exam reveals no gallop and no friction rub.  No murmur heard. Pulmonary/Chest: Breath sounds normal. No respiratory distress. He has no wheezes. He has no rales.  Abdominal: Soft. Bowel sounds are normal. He exhibits no mass. There is no hepatosplenomegaly. There is no tenderness. There is no rebound, no guarding and no CVA tenderness.  Musculoskeletal: Normal range of motion. He exhibits no edema or tenderness.   Lymphadenopathy:    He has no cervical adenopathy.  Neurological: He is alert and oriented to person, place, and time. He has normal strength and normal reflexes. No cranial nerve deficit.  Skin: Skin is warm. No rash noted.  Nursing note and vitals reviewed.     Assessment & Plan  Problem List Items Addressed This Visit      Cardiovascular and Mediastinum   Coronary atherosclerosis   Relevant Medications   olmesartan-hydrochlorothiazide (BENICAR HCT) 40-25 MG tablet   metoprolol succinate (TOPROL-XL) 50 MG 24 hr tablet   diltiazem (DILACOR XR) 240 MG 24 hr capsule   aspirin 81 MG tablet   lovastatin (MEVACOR) 20 MG tablet   A-fib (HCC)   Relevant Medications   olmesartan-hydrochlorothiazide (BENICAR HCT) 40-25 MG tablet   metoprolol succinate (TOPROL-XL) 50 MG 24 hr tablet   diltiazem (DILACOR XR) 240 MG 24 hr capsule   aspirin 81 MG tablet   lovastatin (MEVACOR) 20 MG tablet   levothyroxine (SYNTHROID, LEVOTHROID) 100 MCG tablet   BP (high blood pressure) - Primary   Relevant Medications   olmesartan-hydrochlorothiazide (BENICAR HCT) 40-25 MG tablet   metoprolol succinate (TOPROL-XL) 50 MG 24 hr tablet   diltiazem (DILACOR XR) 240 MG 24 hr capsule   aspirin 81 MG tablet   lovastatin (MEVACOR) 20 MG tablet   Other Relevant Orders   Renal Function Panel   Aortic atherosclerosis (HCC)   Relevant Medications   olmesartan-hydrochlorothiazide (BENICAR HCT) 40-25 MG tablet   metoprolol succinate (TOPROL-XL) 50 MG 24 hr tablet   diltiazem (DILACOR XR) 240 MG 24 hr capsule   aspirin 81 MG tablet   lovastatin (MEVACOR) 20 MG tablet     Other   Familial multiple lipoprotein-type hyperlipidemia   Relevant Medications   olmesartan-hydrochlorothiazide (BENICAR HCT) 40-25 MG tablet   metoprolol succinate (TOPROL-XL) 50 MG 24 hr tablet   diltiazem (DILACOR XR) 240 MG 24 hr capsule   aspirin 81 MG tablet   lovastatin (MEVACOR) 20 MG tablet   Other Relevant Orders   Lipid  panel   Gout   Relevant Medications   allopurinol (ZYLOPRIM) 100 MG tablet    Other Visit Diagnoses    Hypothyroidism, unspecified type       Relevant Medications   metoprolol succinate (TOPROL-XL) 50 MG 24 hr tablet   levothyroxine (SYNTHROID, LEVOTHROID) 100 MCG tablet   Other Relevant Orders   TSH      Meds ordered this encounter  Medications  . olmesartan-hydrochlorothiazide (BENICAR HCT) 40-25 MG tablet    Sig: Take 1 tablet by mouth daily.    Dispense:  90 tablet    Refill:  1  . metoprolol succinate (TOPROL-XL) 50 MG 24 hr tablet    Sig: Take 1 tablet (  50 mg total) by mouth daily.    Dispense:  90 tablet    Refill:  1  . diltiazem (DILACOR XR) 240 MG 24 hr capsule    Sig: Take 1 capsule (240 mg total) by mouth daily.    Dispense:  90 capsule    Refill:  1  . aspirin 81 MG tablet    Sig: Take 1 tablet (81 mg total) by mouth daily.    Dispense:  30 tablet    Refill:  11  . allopurinol (ZYLOPRIM) 100 MG tablet    Sig: Take 1 tablet (100 mg total) by mouth daily.    Dispense:  90 tablet    Refill:  1  . lovastatin (MEVACOR) 20 MG tablet    Sig: Take 1 tablet (20 mg total) by mouth daily.    Dispense:  90 tablet    Refill:  1  . levothyroxine (SYNTHROID, LEVOTHROID) 100 MCG tablet    Sig: Take 1 tablet (100 mcg total) by mouth daily.    Dispense:  90 tablet    Refill:  1      Dr. Elizabeth Sauer [facility]  07/03/17

## 2017-07-04 LAB — LIPID PANEL
Chol/HDL Ratio: 4 ratio (ref 0.0–5.0)
Cholesterol, Total: 131 mg/dL (ref 100–199)
HDL: 33 mg/dL — AB (ref >39)
LDL Calculated: 59 mg/dL (ref 0–99)
TRIGLYCERIDES: 193 mg/dL — AB (ref 0–149)
VLDL CHOLESTEROL CAL: 39 mg/dL (ref 5–40)

## 2017-07-04 LAB — RENAL FUNCTION PANEL
ALBUMIN: 4.8 g/dL (ref 3.5–4.8)
BUN/Creatinine Ratio: 15 (ref 10–24)
BUN: 25 mg/dL (ref 8–27)
CHLORIDE: 99 mmol/L (ref 96–106)
CO2: 21 mmol/L (ref 20–29)
Calcium: 9.9 mg/dL (ref 8.6–10.2)
Creatinine, Ser: 1.69 mg/dL — ABNORMAL HIGH (ref 0.76–1.27)
GFR calc non Af Amer: 39 mL/min/{1.73_m2} — ABNORMAL LOW (ref >59)
GFR, EST AFRICAN AMERICAN: 46 mL/min/{1.73_m2} — AB (ref >59)
Glucose: 78 mg/dL (ref 65–99)
Phosphorus: 3.2 mg/dL (ref 2.5–4.5)
Potassium: 4.4 mmol/L (ref 3.5–5.2)
Sodium: 138 mmol/L (ref 134–144)

## 2017-07-04 LAB — TSH: TSH: 4.02 u[IU]/mL (ref 0.450–4.500)

## 2017-08-31 ENCOUNTER — Other Ambulatory Visit: Payer: Self-pay | Admitting: Family Medicine

## 2017-08-31 DIAGNOSIS — I480 Paroxysmal atrial fibrillation: Secondary | ICD-10-CM

## 2017-08-31 DIAGNOSIS — I10 Essential (primary) hypertension: Secondary | ICD-10-CM

## 2017-08-31 DIAGNOSIS — E7849 Other hyperlipidemia: Secondary | ICD-10-CM

## 2018-01-06 ENCOUNTER — Ambulatory Visit: Payer: Managed Care, Other (non HMO) | Admitting: Family Medicine

## 2018-01-06 ENCOUNTER — Encounter: Payer: Self-pay | Admitting: Family Medicine

## 2018-01-06 VITALS — BP 132/64 | HR 64 | Ht 71.0 in | Wt 211.0 lb

## 2018-01-06 DIAGNOSIS — R69 Illness, unspecified: Secondary | ICD-10-CM

## 2018-01-06 DIAGNOSIS — M1A9XX0 Chronic gout, unspecified, without tophus (tophi): Secondary | ICD-10-CM

## 2018-01-06 DIAGNOSIS — E7849 Other hyperlipidemia: Secondary | ICD-10-CM | POA: Diagnosis not present

## 2018-01-06 DIAGNOSIS — I10 Essential (primary) hypertension: Secondary | ICD-10-CM

## 2018-01-06 DIAGNOSIS — I480 Paroxysmal atrial fibrillation: Secondary | ICD-10-CM

## 2018-01-06 DIAGNOSIS — E039 Hypothyroidism, unspecified: Secondary | ICD-10-CM

## 2018-01-06 MED ORDER — DILTIAZEM HCL ER 240 MG PO CP24
ORAL_CAPSULE | ORAL | 1 refills | Status: DC
Start: 2018-01-06 — End: 2018-07-24

## 2018-01-06 MED ORDER — OLMESARTAN MEDOXOMIL-HCTZ 40-25 MG PO TABS: 1.0000 | ORAL_TABLET | Freq: Every day | ORAL | 1 refills | Status: DC

## 2018-01-06 MED ORDER — METOPROLOL SUCCINATE ER 50 MG PO TB24: 50.0000 mg | ORAL_TABLET | Freq: Every day | ORAL | 1 refills | Status: DC

## 2018-01-06 MED ORDER — LOVASTATIN 20 MG PO TABS: 20.0000 mg | ORAL_TABLET | Freq: Every day | ORAL | 1 refills | Status: DC

## 2018-01-06 MED ORDER — ALLOPURINOL 100 MG PO TABS: 100.0000 mg | ORAL_TABLET | Freq: Every day | ORAL | 1 refills | Status: DC

## 2018-01-06 MED ORDER — LEVOTHYROXINE SODIUM 100 MCG PO TABS: 100.0000 ug | ORAL_TABLET | Freq: Every day | ORAL | 1 refills | Status: DC

## 2018-01-06 NOTE — Progress Notes (Signed)
Date:  01/06/2018   Name:  [name]   DOB:  [birth year]   MRN:  [id number]   Chief Complaint: Gout; Hyperlipidemia; Hypertension; and Hypothyroidism  Hyperlipidemia  This is a chronic problem. The current episode started more than 1 year ago. The problem is controlled. Recent lipid tests were reviewed and are normal. Exacerbating diseases include hypothyroidism. He has no history of chronic renal disease, diabetes, liver disease or obesity. There are no known factors aggravating his hyperlipidemia. Pertinent negatives include no chest pain, focal sensory loss, focal weakness, leg pain, myalgias or shortness of breath. Current antihyperlipidemic treatment includes statins. The current treatment provides moderate improvement of lipids. There are no compliance problems.  Risk factors for coronary artery disease include hypertension, dyslipidemia and post-menopausal.  Hypertension  This is a chronic problem. The current episode started more than 1 year ago. The problem has been waxing and waning since onset. The problem is controlled. Pertinent negatives include no anxiety, blurred vision, chest pain, headaches, malaise/fatigue, neck pain, orthopnea, palpitations, peripheral edema, PND, shortness of breath or sweats. There are no associated agents to hypertension. Risk factors for coronary artery disease include dyslipidemia and obesity. Past treatments include angiotensin blockers, beta blockers, calcium channel blockers and diuretics. The current treatment provides moderate improvement. There are no compliance problems.  There is no history of angina, kidney disease, CAD/MI, CVA, heart failure, left ventricular hypertrophy, PVD or retinopathy. Identifiable causes of hypertension include a thyroid problem. There is no history of chronic renal disease, a hypertension causing med or renovascular disease. Coarctation of the aorta: tsh.  Thyroid Problem  Presents for follow-up visit. Patient reports no  anxiety, cold intolerance, constipation, depressed mood, diaphoresis, diarrhea, fatigue, hair loss, heat intolerance, hoarse voice, leg swelling, nail problem, palpitations, tremors, visual change, weight gain or weight loss. The symptoms have been stable. His past medical history is significant for hyperlipidemia. There is no history of diabetes or heart failure.     Review of Systems  Constitutional: Negative for chills, diaphoresis, fatigue, fever, malaise/fatigue, weight gain and weight loss.  HENT: Negative for drooling, ear discharge, ear pain, hoarse voice and sore throat.   Eyes: Negative for blurred vision.  Respiratory: Negative for cough, shortness of breath and wheezing.   Cardiovascular: Negative for chest pain, palpitations, orthopnea, leg swelling and PND.  Gastrointestinal: Negative for abdominal pain, blood in stool, constipation, diarrhea and nausea.  Endocrine: Negative for cold intolerance, heat intolerance and polydipsia.  Genitourinary: Negative for dysuria, frequency, hematuria and urgency.  Musculoskeletal: Negative for back pain, myalgias and neck pain.  Skin: Negative for rash.  Allergic/Immunologic: Negative for environmental allergies.  Neurological: Negative for dizziness, tremors, focal weakness and headaches.  Hematological: Does not bruise/bleed easily.  Psychiatric/Behavioral: Negative for suicidal ideas. The patient is not nervous/anxious.     Patient Active Problem List   Diagnosis Date Noted  . Aortic atherosclerosis (HCC) 07/19/2015  . Anemia of renal disease 07/19/2015  . Heart valve disease 01/18/2015  . 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 Pericarditis associated with severe chronic anemia 07/05/2014  . Coronary atherosclerosis 07/05/2014  . A-fib (HCC) 07/05/2014  . Gout 07/05/2014  . BP (high blood pressure) 07/05/2014  . Adult hypothyroidism 07/05/2014  . Arthritis of knee, degenerative 07/05/2014  . Benign  essential HTN 05/20/2014    No Known Allergies  Past Surgical History:  Procedure Laterality Date  . APPENDECTOMY    . CARDIAC SURGERY     quad. bypass  . peptic  ulcer     removed  . TONSILLECTOMY      Social History   Tobacco Use  . Smoking status: Never Smoker  . Smokeless tobacco: Current User    Types: Chew  Substance Use Topics  . Alcohol use: Yes    Alcohol/week: 0.0 standard drinks  . Drug use: No     Medication list has been reviewed and updated.  Current Meds  Medication Sig  . allopurinol (ZYLOPRIM) 100 MG tablet Take 1 tablet (100 mg total) by mouth daily.  Marland Kitchen aspirin 81 MG tablet Take 1 tablet (81 mg total) by mouth daily.  Marland Kitchen DILT-XR 240 MG 24 hr capsule TAKE 1 CAPSULE(240 MG) BY MOUTH DAILY  . levothyroxine (SYNTHROID, LEVOTHROID) 100 MCG tablet Take 1 tablet (100 mcg total) by mouth daily.  Marland Kitchen lovastatin (MEVACOR) 20 MG tablet Take 1 tablet (20 mg total) by mouth daily.  . metoprolol succinate (TOPROL-XL) 50 MG 24 hr tablet Take 1 tablet (50 mg total) by mouth daily.  Marland Kitchen olmesartan-hydrochlorothiazide (BENICAR HCT) 40-25 MG tablet Take 1 tablet by mouth daily.    PHQ 2/9 Scores 01/06/2018 01/03/2017 01/03/2017 07/19/2015  PHQ - 2 Score 0 0 0 0  PHQ- 9 Score 0 0 - -    Physical Exam  Constitutional: He is oriented to person, place, and time.  HENT:  Head: Normocephalic.  Right Ear: External ear normal.  Left Ear: External ear normal.  Nose: Nose normal.  Mouth/Throat: Oropharynx is clear and moist.  Eyes: Pupils are equal, round, and reactive to light. Conjunctivae and EOM are normal. Right eye exhibits no discharge. Left eye exhibits no discharge. No scleral icterus.  Neck: Normal range of motion. Neck supple. No JVD present. No tracheal deviation present. No thyromegaly present.  Cardiovascular: Normal rate, regular rhythm, normal heart sounds and intact distal pulses. Exam reveals no gallop and no friction rub.  No murmur heard. Pulmonary/Chest:  Breath sounds normal. No respiratory distress. He has no wheezes. He has no rales.  Abdominal: Soft. Bowel sounds are normal. He exhibits no mass. There is no hepatosplenomegaly. There is no tenderness. There is no rebound, no guarding and no CVA tenderness.  Musculoskeletal: Normal range of motion. He exhibits no edema or tenderness.  Lymphadenopathy:    He has no cervical adenopathy.  Neurological: He is alert and oriented to person, place, and time. He has normal strength and normal reflexes. No cranial nerve deficit.  Skin: Skin is warm. No rash noted.  Nursing note and vitals reviewed.   BP 132/64   Pulse 64   Ht 5\' 11"  (1.803 m)   Wt 211 lb (95.7 kg)   BMI 29.43 kg/m   Assessment and Plan: 1. Chronic gout without tophus, unspecified cause, unspecified site Chronic.  Stable.  Continue allopurinol 1 tablet daily. - allopurinol (ZYLOPRIM) 100 MG tablet; Take 1 tablet (100 mg total) by mouth daily.  Dispense: 90 tablet; Refill: 1  2. Essential hypertension Tonic.  Controlled.  Continue etizolam XR 240 daily, metoprolol XL 50 mg 1 a day, olmesartan hydrochlorothiazide 40-25 daily. - diltiazem (DILT-XR) 240 MG 24 hr capsule; TAKE 1 CAPSULE(240 MG) BY MOUTH DAILY  Dispense: 90 capsule; Refill: 1 - metoprolol succinate (TOPROL-XL) 50 MG 24 hr tablet; Take 1 tablet (50 mg total) by mouth daily.  Dispense: 90 tablet; Refill: 1 - olmesartan-hydrochlorothiazide (BENICAR HCT) 40-25 MG tablet; Take 1 tablet by mouth daily.  Dispense: 90 tablet; Refill: 1  3. Benign essential HTN Duplicate see above -  diltiazem (DILT-XR) 240 MG 24 hr capsule; TAKE 1 CAPSULE(240 MG) BY MOUTH DAILY  Dispense: 90 capsule; Refill: 1    5. Familial multiple lipoprotein-type hyperlipidemia Chronic.  Controlled.  Lovastatin 20 mg daily.  Will recheck lipid panel next visit. - lovastatin (MEVACOR) 20 MG tablet; Take 1 tablet (20 mg total) by mouth daily.  Dispense: 90 tablet; Refill: 1  6. Taking medication for  chronic disease Patient on statin agent.  Will check hepatic panel on next visit.  7. Hypothyroidism, unspecified type Chronic.  Stable.  Controlled.  Will continue levothyroxine 100 mcg daily.  Will check TSH next visit.     Dr. [facility]  01/06/2018

## 2018-07-07 ENCOUNTER — Ambulatory Visit: Payer: Self-pay | Admitting: Family Medicine

## 2018-07-24 ENCOUNTER — Encounter: Payer: Self-pay | Admitting: Family Medicine

## 2018-07-24 ENCOUNTER — Other Ambulatory Visit: Payer: Self-pay

## 2018-07-24 ENCOUNTER — Ambulatory Visit (INDEPENDENT_AMBULATORY_CARE_PROVIDER_SITE_OTHER): Payer: Managed Care, Other (non HMO) | Admitting: Family Medicine

## 2018-07-24 VITALS — BP 136/78 | HR 64 | Ht 71.0 in | Wt 206.0 lb

## 2018-07-24 DIAGNOSIS — I10 Essential (primary) hypertension: Secondary | ICD-10-CM

## 2018-07-24 DIAGNOSIS — D649 Anemia, unspecified: Secondary | ICD-10-CM

## 2018-07-24 DIAGNOSIS — I480 Paroxysmal atrial fibrillation: Secondary | ICD-10-CM | POA: Diagnosis not present

## 2018-07-24 DIAGNOSIS — E7849 Other hyperlipidemia: Secondary | ICD-10-CM

## 2018-07-24 DIAGNOSIS — R69 Illness, unspecified: Secondary | ICD-10-CM

## 2018-07-24 DIAGNOSIS — M1A9XX0 Chronic gout, unspecified, without tophus (tophi): Secondary | ICD-10-CM

## 2018-07-24 DIAGNOSIS — Z1211 Encounter for screening for malignant neoplasm of colon: Secondary | ICD-10-CM

## 2018-07-24 DIAGNOSIS — E039 Hypothyroidism, unspecified: Secondary | ICD-10-CM

## 2018-07-24 LAB — HEMOCCULT GUIAC POC 1CARD (OFFICE): Fecal Occult Blood, POC: NEGATIVE

## 2018-07-24 MED ORDER — METOPROLOL SUCCINATE ER 50 MG PO TB24: 50.0000 mg | ORAL_TABLET | Freq: Every day | ORAL | 1 refills | Status: DC

## 2018-07-24 MED ORDER — OLMESARTAN MEDOXOMIL-HCTZ 40-25 MG PO TABS: 1.0000 | ORAL_TABLET | Freq: Every day | ORAL | 1 refills | Status: DC

## 2018-07-24 MED ORDER — ALLOPURINOL 100 MG PO TABS: 100.0000 mg | ORAL_TABLET | Freq: Every day | ORAL | 1 refills | Status: DC

## 2018-07-24 MED ORDER — DILTIAZEM HCL ER 240 MG PO CP24: ORAL_CAPSULE | ORAL | 1 refills | Status: DC

## 2018-07-24 MED ORDER — LEVOTHYROXINE SODIUM 100 MCG PO TABS: 100.0000 ug | ORAL_TABLET | Freq: Every day | ORAL | 1 refills | Status: DC

## 2018-07-24 MED ORDER — LOVASTATIN 20 MG PO TABS: 20.0000 mg | ORAL_TABLET | Freq: Every day | ORAL | 1 refills | Status: DC

## 2018-07-24 NOTE — Progress Notes (Signed)
Date:  07/24/2018   Name:  [name]   DOB:  16-Jul-1945   MRN:  [id number]   Chief Complaint: Hypertension; Hypothyroidism; Hyperlipidemia; and Gout  Patient is a 73 year old male who presents for a comprehensive physical exam. The patient reports the following problems: none. Health maintenance has been reviewed up to date  Hypertension  This is a chronic problem. The current episode started more than 1 year ago. The problem has been gradually worsening since onset. The problem is controlled. Pertinent negatives include no anxiety, blurred vision, chest pain, headaches, malaise/fatigue, neck pain, orthopnea, palpitations, peripheral edema, PND, shortness of breath or sweats. There are no associated agents to hypertension. There are no known risk factors for coronary artery disease. Past treatments include angiotensin blockers, calcium channel blockers, diuretics and beta blockers. The current treatment provides mild improvement. There are no compliance problems.  There is no history of angina, kidney disease, CAD/MI, CVA, heart failure, left ventricular hypertrophy, PVD or retinopathy. Identifiable causes of hypertension include a thyroid problem. There is no history of chronic renal disease, a hypertension causing med or renovascular disease.  Hyperlipidemia  This is a chronic problem. The current episode started more than 1 year ago. The problem is controlled. Recent lipid tests were reviewed and are normal. He has no history of chronic renal disease, diabetes, hypothyroidism, liver disease, obesity or nephrotic syndrome. There are no known factors aggravating his hyperlipidemia. Pertinent negatives include no chest pain, focal sensory loss, focal weakness, leg pain, myalgias or shortness of breath. Current antihyperlipidemic treatment includes statins. The current treatment provides mild improvement of lipids. There are no compliance problems.  Risk factors for coronary artery disease include  dyslipidemia and hypertension.  Thyroid Problem  Presents for follow-up visit. Patient reports no anxiety, cold intolerance, constipation, depressed mood, diaphoresis, diarrhea, dry skin, fatigue, hair loss, heat intolerance, hoarse voice, leg swelling, menstrual problem, nail problem, palpitations, tremors, visual change, weight gain or weight loss. The symptoms have been stable. His past medical history is significant for hyperlipidemia. There is no history of diabetes or heart failure.    Review of Systems  Constitutional: Negative for chills, diaphoresis, fatigue, fever, malaise/fatigue, weight gain and weight loss.  HENT: Negative for drooling, ear discharge, ear pain, hoarse voice and sore throat.   Eyes: Negative for blurred vision.  Respiratory: Negative for cough, shortness of breath and wheezing.   Cardiovascular: Negative for chest pain, palpitations, orthopnea, leg swelling and PND.  Gastrointestinal: Negative for abdominal pain, blood in stool, constipation, diarrhea and nausea.  Endocrine: Negative for cold intolerance, heat intolerance and polydipsia.  Genitourinary: Negative for dysuria, frequency, hematuria, menstrual problem and urgency.  Musculoskeletal: Negative for back pain, myalgias and neck pain.  Skin: Negative for rash.  Allergic/Immunologic: Negative for environmental allergies.  Neurological: Negative for dizziness, tremors, focal weakness and headaches.  Hematological: Does not bruise/bleed easily.  Psychiatric/Behavioral: Negative for suicidal ideas. The patient is not nervous/anxious.     Patient Active Problem List   Diagnosis Date Noted   Aortic atherosclerosis (HCC) 07/19/2015   Anemia of renal disease 07/19/2015   Heart valve disease 01/18/2015   Familial multiple lipoprotein-type hyperlipidemia 07/05/2014   Pericarditis associated with severe chronic anemia 07/05/2014   Coronary atherosclerosis 07/05/2014   A-fib (HCC) 07/05/2014   Gout  07/05/2014   BP (high blood pressure) 07/05/2014   Adult hypothyroidism 07/05/2014   Arthritis of knee, degenerative 07/05/2014   Benign essential HTN 05/20/2014    No Known  Allergies  Past Surgical History:  Procedure Laterality Date   APPENDECTOMY     CARDIAC SURGERY     quad. bypass   peptic ulcer     removed   TONSILLECTOMY      Social History   Tobacco Use   Smoking status: Never Smoker   Smokeless tobacco: Current User    Types: Chew  Substance Use Topics   Alcohol use: Yes    Alcohol/week: 0.0 standard drinks   Drug use: No     Medication list has been reviewed and updated.  Current Meds  Medication Sig   allopurinol (ZYLOPRIM) 100 MG tablet Take 1 tablet (100 mg total) by mouth daily.   aspirin 81 MG tablet Take 1 tablet (81 mg total) by mouth daily.   diltiazem (DILT-XR) 240 MG 24 hr capsule TAKE 1 CAPSULE(240 MG) BY MOUTH DAILY   levothyroxine (SYNTHROID, LEVOTHROID) 100 MCG tablet Take 1 tablet (100 mcg total) by mouth daily.   lovastatin (MEVACOR) 20 MG tablet Take 1 tablet (20 mg total) by mouth daily.   metoprolol succinate (TOPROL-XL) 50 MG 24 hr tablet Take 1 tablet (50 mg total) by mouth daily.   olmesartan-hydrochlorothiazide (BENICAR HCT) 40-25 MG tablet Take 1 tablet by mouth daily.    PHQ 2/9 Scores 07/24/2018 01/06/2018 01/03/2017 01/03/2017  PHQ - 2 Score 0 0 0 0  PHQ- 9 Score 0 0 0 -    BP Readings from Last 3 Encounters:  07/24/18 136/78  01/06/18 132/64  07/03/17 130/80    Physical Exam Vitals signs and nursing note reviewed.  Constitutional:      Appearance: Normal appearance. He is normal weight.  HENT:     Head: Normocephalic.     Right Ear: Tympanic membrane, ear canal and external ear normal.     Left Ear: Tympanic membrane, ear canal and external ear normal.     Nose: Nose normal. No congestion or rhinorrhea.     Mouth/Throat:     Mouth: Mucous membranes are moist.  Eyes:     General: No scleral  icterus.       Right eye: No discharge.        Left eye: No discharge.     Conjunctiva/sclera: Conjunctivae normal.     Pupils: Pupils are equal, round, and reactive to light.  Neck:     Musculoskeletal: Normal range of motion and neck supple.     Thyroid: No thyromegaly.     Vascular: No JVD.     Trachea: No tracheal deviation.  Cardiovascular:     Rate and Rhythm: Normal rate and regular rhythm.     Heart sounds: Normal heart sounds. No murmur. No friction rub. No gallop.   Pulmonary:     Effort: No respiratory distress.     Breath sounds: Normal breath sounds. No wheezing, rhonchi or rales.  Abdominal:     General: Bowel sounds are normal.     Palpations: Abdomen is rigid. There is no hepatomegaly, splenomegaly or mass.     Tenderness: There is no abdominal tenderness. There is no guarding or rebound.  Genitourinary:    Pubic Area: No rash or pubic lice.      Prostate: Normal. Not enlarged, not tender and no nodules present.     Rectum: Normal. Guaiac result negative.  Musculoskeletal: Normal range of motion.        General: No tenderness.  Lymphadenopathy:     Cervical: No cervical adenopathy.  Skin:    General: Skin  is warm.     Findings: No rash.  Neurological:     Mental Status: He is alert and oriented to person, place, and time.     Cranial Nerves: No cranial nerve deficit.     Deep Tendon Reflexes: Reflexes are normal and symmetric.     Wt Readings from Last 3 Encounters:  07/24/18 206 lb (93.4 kg)  01/06/18 211 lb (95.7 kg)  07/03/17 215 lb (97.5 kg)    BP 136/78    Pulse 64    Ht 5\' 11"  (1.803 m)    Wt 206 lb (93.4 kg)    BMI 28.73 kg/m   Assessment and Plan: 1. Essential hypertension Chronic.  Controlled.  Continue diltiazem 240 once a day metoprolol XL 50 mg once a day olmesartan and hydrochlorothiazide 40-25 once a day will check renal function panel. - diltiazem (DILT-XR) 240 MG 24 hr capsule; TAKE 1 CAPSULE(240 MG) BY MOUTH DAILY  Dispense: 90  capsule; Refill: 1 - metoprolol succinate (TOPROL-XL) 50 MG 24 hr tablet; Take 1 tablet (50 mg total) by mouth daily.  Dispense: 90 tablet; Refill: 1 - olmesartan-hydrochlorothiazide (BENICAR HCT) 40-25 MG tablet; Take 1 tablet by mouth daily.  Dispense: 90 tablet; Refill: 1 - Renal Function Panel  2. Benign essential HTN As noted above - diltiazem (DILT-XR) 240 MG 24 hr capsule; TAKE 1 CAPSULE(240 MG) BY MOUTH DAILY  Dispense: 90 capsule; Refill: 1  3. Chronic gout without tophus, unspecified cause, unspecified site Patient is controlled.  Currently asymptomatic.  Continue allopurinol 100 mg once a day. - allopurinol (ZYLOPRIM) 100 MG tablet; Take 1 tablet (100 mg total) by mouth daily.  Dispense: 90 tablet; Refill: 1  4. Familial multiple lipoprotein-type hyperlipidemia Chronic.  Controlled.  Continue lovastatin once a day 20 mg. - lovastatin (MEVACOR) 20 MG tablet; Take 1 tablet (20 mg total) by mouth daily.  Dispense: 90 tablet; Refill: 1 - Lipid Panel With LDL/HDL Ratio  5. Paroxysmal atrial fibrillation (HCC) As noted secondary to probable idiopathic. - levothyroxine (SYNTHROID) 100 MCG tablet; Take 1 tablet (100 mcg total) by mouth daily.  Dispense: 90 tablet; Refill: 1  6. Hypothyroidism, unspecified type Chronic.  Controlled.  Continue levothyroxine will check TSH and adjust accordingly. - TSH  7. Taking medication for chronic disease Chronic.  Patient is on a statin will check hepatic function panel - Hepatic Function Panel (6)  8. Mild chronic anemia Chronic anemia noted in the past and patient is to be rechecked of the CBC - CBC with Differential/Platelet  9. Colon cancer screening Guaiac exam was negative. - POCT Occult Blood Stool

## 2018-07-25 LAB — CBC WITH DIFFERENTIAL/PLATELET
Basophils Absolute: 0.1 10*3/uL (ref 0.0–0.2)
Basos: 1 %
EOS (ABSOLUTE): 0.4 10*3/uL (ref 0.0–0.4)
Eos: 5 %
Hematocrit: 43.7 % (ref 37.5–51.0)
Hemoglobin: 14 g/dL (ref 13.0–17.7)
Immature Grans (Abs): 0 10*3/uL (ref 0.0–0.1)
Immature Granulocytes: 0 %
Lymphocytes Absolute: 1.3 10*3/uL (ref 0.7–3.1)
Lymphs: 18 %
MCH: 27.1 pg (ref 26.6–33.0)
MCHC: 32 g/dL (ref 31.5–35.7)
MCV: 85 fL (ref 79–97)
Monocytes Absolute: 0.9 10*3/uL (ref 0.1–0.9)
Monocytes: 11 %
Neutrophils Absolute: 5 10*3/uL (ref 1.4–7.0)
Neutrophils: 65 %
Platelets: 212 10*3/uL (ref 150–450)
RBC: 5.16 x10E6/uL (ref 4.14–5.80)
RDW: 13.9 % (ref 11.6–15.4)
WBC: 7.6 10*3/uL (ref 3.4–10.8)

## 2018-07-25 LAB — RENAL FUNCTION PANEL
Albumin: 4.9 g/dL — ABNORMAL HIGH (ref 3.7–4.7)
BUN/Creatinine Ratio: 15 (ref 10–24)
BUN: 29 mg/dL — ABNORMAL HIGH (ref 8–27)
CO2: 22 mmol/L (ref 20–29)
Calcium: 9.8 mg/dL (ref 8.6–10.2)
Chloride: 100 mmol/L (ref 96–106)
Creatinine, Ser: 1.92 mg/dL — ABNORMAL HIGH (ref 0.76–1.27)
GFR calc Af Amer: 39 mL/min/{1.73_m2} — ABNORMAL LOW (ref >59)
GFR calc non Af Amer: 34 mL/min/{1.73_m2} — ABNORMAL LOW (ref >59)
Glucose: 101 mg/dL — ABNORMAL HIGH (ref 65–99)
Phosphorus: 2.8 mg/dL (ref 2.8–4.1)
Potassium: 4.6 mmol/L (ref 3.5–5.2)
Sodium: 138 mmol/L (ref 134–144)

## 2018-07-25 LAB — LIPID PANEL WITH LDL/HDL RATIO
Cholesterol, Total: 140 mg/dL (ref 100–199)
HDL: 32 mg/dL — ABNORMAL LOW (ref >39)
LDL Calculated: 72 mg/dL (ref 0–99)
LDl/HDL Ratio: 2.3 ratio (ref 0.0–3.6)
Triglycerides: 181 mg/dL — ABNORMAL HIGH (ref 0–149)
VLDL Cholesterol Cal: 36 mg/dL (ref 5–40)

## 2018-07-25 LAB — HEPATIC FUNCTION PANEL (6)
ALT: 14 IU/L (ref 0–44)
AST: 17 IU/L (ref 0–40)
Alkaline Phosphatase: 73 IU/L (ref 39–117)
Bilirubin Total: 0.8 mg/dL (ref 0.0–1.2)
Bilirubin, Direct: 0.2 mg/dL (ref 0.00–0.40)

## 2018-07-25 LAB — TSH: TSH: 2.25 u[IU]/mL (ref 0.450–4.500)

## 2018-12-09 DIAGNOSIS — R42 Dizziness and giddiness: Secondary | ICD-10-CM | POA: Insufficient documentation

## 2019-01-22 ENCOUNTER — Other Ambulatory Visit: Payer: Self-pay | Admitting: Family Medicine

## 2019-01-22 DIAGNOSIS — I10 Essential (primary) hypertension: Secondary | ICD-10-CM

## 2019-01-23 ENCOUNTER — Ambulatory Visit: Payer: Managed Care, Other (non HMO) | Admitting: Family Medicine

## 2019-01-29 ENCOUNTER — Other Ambulatory Visit: Payer: Self-pay

## 2019-01-29 ENCOUNTER — Encounter: Payer: Self-pay | Admitting: Family Medicine

## 2019-01-29 ENCOUNTER — Ambulatory Visit: Payer: Managed Care, Other (non HMO) | Admitting: Family Medicine

## 2019-01-29 VITALS — BP 136/64 | HR 52 | Ht 71.0 in | Wt 208.0 lb

## 2019-01-29 DIAGNOSIS — E7849 Other hyperlipidemia: Secondary | ICD-10-CM | POA: Diagnosis not present

## 2019-01-29 DIAGNOSIS — I480 Paroxysmal atrial fibrillation: Secondary | ICD-10-CM

## 2019-01-29 DIAGNOSIS — I10 Essential (primary) hypertension: Secondary | ICD-10-CM

## 2019-01-29 DIAGNOSIS — L309 Dermatitis, unspecified: Secondary | ICD-10-CM

## 2019-01-29 DIAGNOSIS — M1A9XX0 Chronic gout, unspecified, without tophus (tophi): Secondary | ICD-10-CM | POA: Diagnosis not present

## 2019-01-29 MED ORDER — LEVOTHYROXINE SODIUM 100 MCG PO TABS: 100.0000 ug | ORAL_TABLET | Freq: Every day | ORAL | 1 refills | Status: DC

## 2019-01-29 MED ORDER — OLMESARTAN MEDOXOMIL-HCTZ 40-25 MG PO TABS: 1.0000 | ORAL_TABLET | Freq: Every day | ORAL | 1 refills | Status: DC

## 2019-01-29 MED ORDER — ALLOPURINOL 100 MG PO TABS: 100.0000 mg | ORAL_TABLET | Freq: Every day | ORAL | 1 refills | Status: DC

## 2019-01-29 MED ORDER — METOPROLOL SUCCINATE ER 50 MG PO TB24: ORAL_TABLET | ORAL | 1 refills | Status: DC

## 2019-01-29 MED ORDER — TRIAMCINOLONE ACETONIDE 0.1 % EX CREA: 1.0000 "application " | TOPICAL_CREAM | Freq: Two times a day (BID) | CUTANEOUS | 1 refills | Status: DC

## 2019-01-29 MED ORDER — LOVASTATIN 20 MG PO TABS: 20.0000 mg | ORAL_TABLET | Freq: Every day | ORAL | 1 refills | Status: DC

## 2019-01-29 NOTE — Progress Notes (Signed)
Date:  01/29/2019   Name:  [name]   DOB:  04-26-1945   MRN:  [id number]   Chief Complaint: Hypothyroidism, Hyperlipidemia, Gout, Hypertension, and Rash (as needed )  Hyperlipidemia This is a chronic problem. The current episode started more than 1 year ago. The problem is controlled. Recent lipid tests were reviewed and are normal. He has no history of chronic renal disease, diabetes, hypothyroidism, liver disease, obesity or nephrotic syndrome. There are no known factors aggravating his hyperlipidemia. Pertinent negatives include no chest pain, focal sensory loss, focal weakness, leg pain, myalgias or shortness of breath. He is currently on no antihyperlipidemic treatment. The current treatment provides moderate improvement of lipids. There are no compliance problems.  Risk factors for coronary artery disease include hypertension, dyslipidemia, male sex and post-menopausal.  Hypertension This is a chronic problem. The current episode started more than 1 year ago. The problem has been waxing and waning since onset. The problem is controlled. Pertinent negatives include no anxiety, blurred vision, chest pain, headaches, malaise/fatigue, neck pain, orthopnea, palpitations, peripheral edema, PND, shortness of breath or sweats. There are no associated agents to hypertension. There are no known risk factors for coronary artery disease. Past treatments include calcium channel blockers, angiotensin blockers, diuretics and beta blockers. The current treatment provides moderate improvement. There are no compliance problems.  There is no history of angina, kidney disease, CAD/MI, CVA, heart failure, left ventricular hypertrophy, PVD or retinopathy. Identifiable causes of hypertension include a thyroid problem. There is no history of chronic renal disease, a hypertension causing med or renovascular disease.  Rash This is a chronic problem. The current episode started more than 1 year ago. The problem is  unchanged. The affected locations include the right toes, right foot, left foot and left toes. The rash is characterized by redness. Pertinent negatives include no cough, diarrhea, fatigue, fever, shortness of breath or sore throat. Past treatments include topical steroids. The treatment provided moderate relief.  Thyroid Problem Presents for follow-up visit. Patient reports no anxiety, cold intolerance, constipation, depressed mood, diaphoresis, diarrhea, fatigue, hair loss, heat intolerance, hoarse voice, leg swelling, nail problem, palpitations, tremors, visual change, weight gain or weight loss. The symptoms have been stable. His past medical history is significant for hyperlipidemia. There is no history of diabetes or heart failure.    Lab Results  Component Value Date   CREATININE 1.92 (H) 07/24/2018   BUN 29 (H) 07/24/2018   NA 138 07/24/2018   K 4.6 07/24/2018   CL 100 07/24/2018   CO2 22 07/24/2018   Lab Results  Component Value Date   CHOL 140 07/24/2018   HDL 32 (L) 07/24/2018   LDLCALC 72 07/24/2018   TRIG 181 (H) 07/24/2018   CHOLHDL 4.0 07/03/2017   Lab Results  Component Value Date   TSH 2.250 07/24/2018   No results found for: HGBA1C   Review of Systems  Constitutional: Negative for chills, diaphoresis, fatigue, fever, malaise/fatigue, weight gain and weight loss.  HENT: Negative for drooling, ear discharge, ear pain, hoarse voice and sore throat.   Eyes: Negative for blurred vision.  Respiratory: Negative for cough, shortness of breath and wheezing.   Cardiovascular: Negative for chest pain, palpitations, orthopnea, leg swelling and PND.  Gastrointestinal: Negative for abdominal distention, abdominal pain, blood in stool, constipation, diarrhea and nausea.  Endocrine: Negative for cold intolerance, heat intolerance and polydipsia.  Genitourinary: Negative for dysuria, frequency, hematuria and urgency.  Musculoskeletal: Negative for back pain, myalgias and neck  pain.  Skin: Positive for rash.  Allergic/Immunologic: Negative for environmental allergies.  Neurological: Negative for dizziness, tremors, focal weakness and headaches.  Hematological: Does not bruise/bleed easily.  Psychiatric/Behavioral: Negative for suicidal ideas. The patient is not nervous/anxious.     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 bypass   peptic ulcer     removed   TONSILLECTOMY      Social History   Tobacco Use   Smoking status: Never Smoker   Smokeless tobacco: Current User    Types: Chew  Substance Use Topics   Alcohol use: Yes    Alcohol/week: 0.0 standard drinks   Drug use: No     Medication list has been reviewed and updated.  Current Meds  Medication Sig   allopurinol (ZYLOPRIM) 100 MG tablet Take 1 tablet (100 mg total) by mouth daily.   aspirin 81 MG tablet Take 1 tablet (81 mg total) by mouth daily.   diltiazem (TIAZAC) 180 MG 24 hr capsule Take 1 capsule by mouth daily. kowalski   levothyroxine (SYNTHROID) 100 MCG tablet Take 1 tablet (100 mcg total) by mouth daily.   lovastatin (MEVACOR) 20 MG tablet Take 1 tablet (20 mg total) by mouth daily.   metoprolol succinate (TOPROL-XL) 50 MG 24 hr tablet TAKE 1 TABLET(50 MG) BY MOUTH DAILY   olmesartan-hydrochlorothiazide (BENICAR HCT) 40-25 MG tablet Take 1 tablet by  mouth daily.   triamcinolone cream (KENALOG) 0.1 % Apply 1 application topically 2 (two) times daily.    PHQ 2/9 Scores 07/24/2018 01/06/2018 01/03/2017 01/03/2017  PHQ - 2 Score 0 0 0 0  PHQ- 9 Score 0 0 0 -    BP Readings from Last 3 Encounters:  01/29/19 136/64  07/24/18 136/78  01/06/18 132/64    Physical Exam Vitals and nursing note reviewed.  HENT:     Head: Normocephalic.     Right Ear: Tympanic membrane, ear canal and external ear normal.     Left Ear: Tympanic membrane, ear canal and external ear normal.     Nose: Nose normal. No congestion or rhinorrhea.     Mouth/Throat:     Mouth: Mucous membranes are moist.  Eyes:     General: No scleral icterus.       Right eye: No discharge.        Left eye: No discharge.     Conjunctiva/sclera: Conjunctivae normal.     Pupils: Pupils are equal, round, and reactive to light.  Neck:     Thyroid: No thyromegaly.     Vascular: No JVD.     Trachea: No tracheal deviation.  Cardiovascular:     Rate and Rhythm: Normal rate and regular rhythm.     Heart sounds: Normal heart sounds. No murmur. No friction rub. No gallop.   Pulmonary:     Effort: No respiratory distress.     Breath sounds: Normal breath sounds.  No wheezing, rhonchi or rales.  Abdominal:     General: Bowel sounds are normal.     Palpations: Abdomen is soft. There is no mass.     Tenderness: There is no abdominal tenderness. There is no right CVA tenderness, left CVA tenderness, guarding or rebound.  Musculoskeletal:        General: No tenderness. Normal range of motion.     Cervical back: Normal range of motion and neck supple.  Lymphadenopathy:     Cervical: No cervical adenopathy.  Skin:    General: Skin is warm.     Coloration: Skin is not pale.     Findings: No bruising, erythema or rash.  Neurological:     Mental Status: He is alert and oriented to person, place, and time.     Cranial Nerves: No cranial nerve deficit.     Deep Tendon Reflexes: Reflexes  are normal and symmetric.     Wt Readings from Last 3 Encounters:  01/29/19 208 lb (94.3 kg)  07/24/18 206 lb (93.4 kg)  01/06/18 211 lb (95.7 kg)    BP 136/64    Pulse (!) 52    Ht 5\' 11"  (1.803 m)    Wt 208 lb (94.3 kg)    BMI 29.01 kg/m   Assessment and Plan:  1. Chronic gout without tophus, unspecified cause, unspecified site Chronic.  Controlled.  Stable.  Patient takes allopurinol 100 mg once a day for control of hyper uric acid. - allopurinol (ZYLOPRIM) 100 MG tablet; Take 1 tablet (100 mg total) by mouth daily.  Dispense: 90 tablet; Refill: 1  2. Paroxysmal atrial fibrillation (HCC) Chronic.  Controlled.  Patient is on levothyroxine to control atrial fibrillation.  Patient's last TSH was reviewed and was no normal limits and will continue Synthroid 100 mg 1 a day. - levothyroxine (SYNTHROID) 100 MCG tablet; Take 1 tablet (100 mcg total) by mouth daily.  Dispense: 90 tablet; Refill: 1  3. Familial multiple lipoprotein-type hyperlipidemia Chronic.  Controlled.  Stable.  Continue lovastatin 20 mg once a day. - lovastatin (MEVACOR) 20 MG tablet; Take 1 tablet (20 mg total) by mouth daily.  Dispense: 90 tablet; Refill: 1  4. Essential hypertension Chronic.  Controlled.  Stable.  Patient is now controlled on metoprolol XL 50 mg once a day, and olmesartan-hydrochlorothiazide 40-25 mg once a day. - metoprolol succinate (TOPROL-XL) 50 MG 24 hr tablet; TAKE 1 TABLET(50 MG) BY MOUTH DAILY  Dispense: 90 tablet; Refill: 1 - olmesartan-hydrochlorothiazide (BENICAR HCT) 40-25 MG tablet; Take 1 tablet by mouth daily.  Dispense: 90 tablet; Refill: 1  5. Dermatitis Episodic.  Controlled.  Patient uses triamcinolone Kenalog on a as needed basis twice a day as needed. - triamcinolone cream (KENALOG) 0.1 %; Apply 1 application topically 2 (two) times daily.  Dispense: 30 g; Refill: 1

## 2019-02-24 ENCOUNTER — Telehealth: Payer: Self-pay

## 2019-02-24 NOTE — Telephone Encounter (Signed)
Pt received #1 covid vax yesterday

## 2019-07-23 DIAGNOSIS — N183 Chronic kidney disease, stage 3 unspecified: Secondary | ICD-10-CM | POA: Insufficient documentation

## 2019-07-27 DIAGNOSIS — I10 Essential (primary) hypertension: Secondary | ICD-10-CM | POA: Diagnosis not present

## 2019-07-27 DIAGNOSIS — N1832 Chronic kidney disease, stage 3b: Secondary | ICD-10-CM | POA: Diagnosis not present

## 2019-08-03 ENCOUNTER — Other Ambulatory Visit: Payer: Self-pay

## 2019-08-03 ENCOUNTER — Ambulatory Visit (INDEPENDENT_AMBULATORY_CARE_PROVIDER_SITE_OTHER): Payer: Medicare Other | Admitting: Family Medicine

## 2019-08-03 ENCOUNTER — Encounter: Payer: Self-pay | Admitting: Family Medicine

## 2019-08-03 VITALS — BP 130/70 | HR 72 | Ht 71.0 in | Wt 206.0 lb

## 2019-08-03 DIAGNOSIS — E7849 Other hyperlipidemia: Secondary | ICD-10-CM | POA: Diagnosis not present

## 2019-08-03 DIAGNOSIS — M1A9XX0 Chronic gout, unspecified, without tophus (tophi): Secondary | ICD-10-CM | POA: Diagnosis not present

## 2019-08-03 DIAGNOSIS — I10 Essential (primary) hypertension: Secondary | ICD-10-CM

## 2019-08-03 DIAGNOSIS — I480 Paroxysmal atrial fibrillation: Secondary | ICD-10-CM

## 2019-08-03 DIAGNOSIS — E039 Hypothyroidism, unspecified: Secondary | ICD-10-CM

## 2019-08-03 DIAGNOSIS — N1832 Chronic kidney disease, stage 3b: Secondary | ICD-10-CM | POA: Diagnosis not present

## 2019-08-03 MED ORDER — ALLOPURINOL 100 MG PO TABS: 100.0000 mg | ORAL_TABLET | Freq: Every day | ORAL | 1 refills | Status: DC

## 2019-08-03 MED ORDER — OLMESARTAN MEDOXOMIL-HCTZ 40-25 MG PO TABS: 1.0000 | ORAL_TABLET | Freq: Every day | ORAL | 1 refills | Status: DC

## 2019-08-03 MED ORDER — METOPROLOL SUCCINATE ER 50 MG PO TB24: ORAL_TABLET | ORAL | 1 refills | Status: DC

## 2019-08-03 MED ORDER — LEVOTHYROXINE SODIUM 100 MCG PO TABS: 100.0000 ug | ORAL_TABLET | Freq: Every day | ORAL | 1 refills | Status: DC

## 2019-08-03 MED ORDER — LOVASTATIN 20 MG PO TABS: 20.0000 mg | ORAL_TABLET | Freq: Every day | ORAL | 1 refills | Status: DC

## 2019-08-03 NOTE — Progress Notes (Signed)
Date:  08/03/2019   Name:  [name]   DOB:  11-19-1945   MRN:  [id number]   Chief Complaint: Hyperlipidemia, Hypertension, Hypothyroidism, and Gout  Hyperlipidemia This is a chronic problem. The current episode started more than 1 year ago. The problem is controlled. Recent lipid tests were reviewed and are normal. Exacerbating diseases include hypothyroidism. He has no history of chronic renal disease, diabetes, liver disease, obesity or nephrotic syndrome. There are no known factors aggravating his hyperlipidemia. Pertinent negatives include no chest pain, focal sensory loss, focal weakness, leg pain, myalgias or shortness of breath. Current antihyperlipidemic treatment includes statins. The current treatment provides moderate improvement of lipids. There are no compliance problems.  Risk factors for coronary artery disease include dyslipidemia and hypertension.  Hypertension This is a chronic problem. The current episode started more than 1 year ago. The problem has been gradually improving since onset. The problem is controlled. Pertinent negatives include no anxiety, blurred vision, chest pain, headaches, malaise/fatigue, neck pain, orthopnea, palpitations, peripheral edema, PND, shortness of breath or sweats. Agents associated with hypertension include NSAIDs. Risk factors for coronary artery disease include dyslipidemia. Past treatments include angiotensin blockers, calcium channel blockers, beta blockers and diuretics. The current treatment provides moderate improvement. There are no compliance problems.  There is no history of angina, kidney disease, CAD/MI, CVA, heart failure, left ventricular hypertrophy, PVD or retinopathy. Identifiable causes of hypertension include a thyroid problem. There is no history of chronic renal disease, a hypertension causing med or renovascular disease.  Thyroid Problem Presents for follow-up visit. Patient reports no anxiety, cold intolerance,  constipation, depressed mood, diaphoresis, diarrhea, dry skin, fatigue, hair loss, heat intolerance, hoarse voice, leg swelling, menstrual problem, nail problem, palpitations, tremors, visual change, weight gain or weight loss. The symptoms have been stable. His past medical history is significant for hyperlipidemia. There is no history of diabetes or heart failure.    Lab Results  Component Value Date   CREATININE 1.92 (H) 07/24/2018   BUN 29 (H) 07/24/2018   NA 138 07/24/2018   K 4.6 07/24/2018   CL 100 07/24/2018   CO2 22 07/24/2018   Lab Results  Component Value Date   CHOL 140 07/24/2018   HDL 32 (L) 07/24/2018   LDLCALC 72 07/24/2018   TRIG 181 (H) 07/24/2018   CHOLHDL 4.0 07/03/2017   Lab Results  Component Value Date   TSH 2.250 07/24/2018   No results found for: HGBA1C Lab Results  Component Value Date   WBC 7.6 07/24/2018   HGB 14.0 07/24/2018   HCT 43.7 07/24/2018   MCV 85 07/24/2018   PLT 212 07/24/2018   Lab Results  Component Value Date   ALT 14 07/24/2018   AST 17 07/24/2018   ALKPHOS 73 07/24/2018   BILITOT 0.8 07/24/2018     Review of Systems  Constitutional: Negative for diaphoresis, fatigue, malaise/fatigue, weight gain and weight loss.  HENT: Negative for hoarse voice.   Eyes: Negative for blurred vision.  Respiratory: Negative for shortness of breath.   Cardiovascular: Negative for chest pain, palpitations, orthopnea and PND.  Gastrointestinal: Negative for constipation and diarrhea.  Endocrine: Negative for cold intolerance and heat intolerance.  Genitourinary: Negative for menstrual problem.  Musculoskeletal: Negative for myalgias and neck pain.  Neurological: Negative for tremors, focal weakness and headaches.  Psychiatric/Behavioral: The patient is not nervous/anxious.     Patient Active Problem List   Diagnosis Date Noted  . Aortic atherosclerosis ([place]) 07/19/2015  .  Anemia of renal disease 07/19/2015  . Heart valve disease  01/18/2015  . 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 Pericarditis associated with severe chronic anemia 07/05/2014  . Coronary atherosclerosis 07/05/2014  . A-fib ([facility]) 07/05/2014  . Gout 07/05/2014  . BP (high blood pressure) 07/05/2014  . Adult hypothyroidism 07/05/2014  . Arthritis of knee, degenerative 07/05/2014  . Benign essential HTN 05/20/2014    No Known Allergies  Past Surgical History:  Procedure Laterality Date  . APPENDECTOMY    . CARDIAC SURGERY     quad. bypass  . peptic ulcer     removed  . TONSILLECTOMY      Social History   Tobacco Use  . Smoking status: Never Smoker  . Smokeless tobacco: Current User    Types: Chew  Substance Use Topics  . Alcohol use: Yes    Alcohol/week: 0.0 standard drinks  . Drug use: No     Medication list has been reviewed and updated.  Current Meds  Medication Sig  . allopurinol (ZYLOPRIM) 100 MG tablet Take 1 tablet (100 mg total) by mouth daily.  Marland Kitchen aspirin 81 MG tablet Take 1 tablet (81 mg total) by mouth daily.  Marland Kitchen diltiazem (TIAZAC) 180 MG 24 hr capsule Take 1 capsule by mouth daily. kowalski  . levothyroxine (SYNTHROID) 100 MCG tablet Take 1 tablet (100 mcg total) by mouth daily.  Marland Kitchen lovastatin (MEVACOR) 20 MG tablet Take 1 tablet (20 mg total) by mouth daily.  . metoprolol succinate (TOPROL-XL) 50 MG 24 hr tablet TAKE 1 TABLET(50 MG) BY MOUTH DAILY  . olmesartan-hydrochlorothiazide (BENICAR HCT) 40-25 MG tablet Take 1 tablet by mouth daily.  Marland Kitchen triamcinolone cream (KENALOG) 0.1 % Apply 1 application topically 2 (two) times daily.    PHQ 2/9 Scores 08/03/2019 07/24/2018 01/06/2018 01/03/2017  PHQ - 2 Score 0 0 0 0  PHQ- 9 Score 0 0 0 0    GAD 7 : Generalized Anxiety Score 08/03/2019  Nervous, Anxious, on Edge 0  Control/stop worrying 0  Worry too much - different things 0  Trouble relaxing 0  Restless 0  Easily annoyed or irritable 0  Afraid - awful might happen 0  Total GAD 7 Score 0     BP Readings from Last 3 Encounters:  08/03/19 130/70  01/29/19 136/64  07/24/18 136/78    Physical Exam Vitals and nursing note reviewed.  HENT:     Head: Normocephalic.     Right Ear: Tympanic membrane, ear canal and external ear normal.     Left Ear: Tympanic membrane, ear canal and external ear normal.     Nose: Nose normal.  Eyes:     General: No scleral icterus.       Right eye: No discharge.        Left eye: No discharge.     Conjunctiva/sclera: Conjunctivae normal.     Pupils: Pupils are equal, round, and reactive to light.  Neck:     Thyroid: No thyromegaly.     Vascular: No JVD.     Trachea: No tracheal deviation.  Cardiovascular:     Rate and Rhythm: Normal rate and regular rhythm.     Pulses: Normal pulses.     Heart sounds: Normal heart sounds, S1 normal and S2 normal. No murmur heard.  No systolic murmur is present.  No diastolic murmur is present.  No friction rub. No gallop. No S3 or S4 sounds.   Pulmonary:     Effort: No respiratory distress.  Breath sounds: Normal breath sounds. No decreased breath sounds, wheezing, rhonchi or rales.  Abdominal:     General: Bowel sounds are normal.     Palpations: Abdomen is soft. There is no mass.     Tenderness: There is no abdominal tenderness. There is no guarding or rebound.  Musculoskeletal:        General: No tenderness. Normal range of motion.     Cervical back: Normal range of motion and neck supple.     Right lower leg: No edema.     Left lower leg: No edema.  Lymphadenopathy:     Cervical: No cervical adenopathy.  Skin:    General: Skin is warm.     Findings: No rash.  Neurological:     Mental Status: He is alert and oriented to person, place, and time.     Cranial Nerves: No cranial nerve deficit.     Deep Tendon Reflexes: Reflexes are normal and symmetric.     Wt Readings from Last 3 Encounters:  08/03/19 206 lb (93.4 kg)  01/29/19 208 lb (94.3 kg)  07/24/18 206 lb (93.4 kg)    BP  130/70   Pulse 72   Ht 5\' 11"  (1.803 m)   Wt 206 lb (93.4 kg)   BMI 28.73 kg/m   Assessment and Plan: 1. Chronic kidney disease (CKD) stage G3b/A3, moderately decreased glomerular filtration rate (GFR) between 30-44 mL/min/1.73 square meter and albuminuria creatinine ratio greater than 300 mg/g Chronic.  Controlled.  Relatively stable.  Patient is followed by Dr. at which time we will check a parathyroid hormone intact as well as CMP. - Parathyroid hormone, intact (no Ca)  2. Chronic gout without tophus, unspecified cause, unspecified site Chronic.  Controlled.  Stable.  Patient may have had issues with elevated uric acid in the past but is currently stable on current dosing of allopurinol 100 mg daily. - allopurinol (ZYLOPRIM) 100 MG tablet; Take 1 tablet (100 mg total) by mouth daily.  Dispense: 90 tablet; Refill: 1  3. Paroxysmal atrial fibrillation (HCC) Chronic.  Controlled.  Stable.  Patient is followed by Dr. Thedore Mins for A. fib.  We are internal monitoring his thyroid and is covering it with levothyroxine 100 mcg daily. - levothyroxine (SYNTHROID) 100 MCG tablet; Take 1 tablet (100 mcg total) by mouth daily.  Dispense: 90 tablet; Refill: 1  4. Familial multiple lipoprotein-type hyperlipidemia Chronic.  Controlled.  Stable.  Continue lovastatin 20 mg once a day.  Will check lipid panel. - lovastatin (MEVACOR) 20 MG tablet; Take 1 tablet (20 mg total) by mouth daily.  Dispense: 90 tablet; Refill: 1 - Lipid Panel With LDL/HDL Ratio  5. Essential hypertension Chronic.  Controlled.  Stable.  Continue metoprolol XL 50 mg daily as well as olmesartan-hydrochlorothiazide 40-25 mg daily.  Check CMP. - metoprolol succinate (TOPROL-XL) 50 MG 24 hr tablet; TAKE 1 TABLET(50 MG) BY MOUTH DAILY  Dispense: 90 tablet; Refill: 1 - olmesartan-hydrochlorothiazide (BENICAR HCT) 40-25 MG tablet; Take 1 tablet by mouth daily.  Dispense: 90 tablet; Refill: 1 - Comprehensive Metabolic Panel  (CMET)  6. Hypothyroidism, unspecified type .  Controlled.  Stable.  Will check thyroid panel with TSH and continue at 100 mcg levothyroxine.  Daily. - Thyroid Panel With TSH

## 2019-08-04 LAB — LIPID PANEL WITH LDL/HDL RATIO
Cholesterol, Total: 138 mg/dL (ref 100–199)
HDL: 30 mg/dL — ABNORMAL LOW (ref >39)
LDL Chol Calc (NIH): 81 mg/dL (ref 0–99)
LDL/HDL Ratio: 2.7 ratio (ref 0.0–3.6)
Triglycerides: 157 mg/dL — ABNORMAL HIGH (ref 0–149)
VLDL Cholesterol Cal: 27 mg/dL (ref 5–40)

## 2019-08-04 LAB — COMPREHENSIVE METABOLIC PANEL
ALT: 9 IU/L (ref 0–44)
AST: 16 IU/L (ref 0–40)
Albumin/Globulin Ratio: 1.9 (ref 1.2–2.2)
Albumin: 4.5 g/dL (ref 3.7–4.7)
Alkaline Phosphatase: 79 IU/L (ref 48–121)
BUN/Creatinine Ratio: 15 (ref 10–24)
BUN: 32 mg/dL — ABNORMAL HIGH (ref 8–27)
Bilirubin Total: 0.6 mg/dL (ref 0.0–1.2)
CO2: 23 mmol/L (ref 20–29)
Calcium: 9.3 mg/dL (ref 8.6–10.2)
Chloride: 102 mmol/L (ref 96–106)
Creatinine, Ser: 2.14 mg/dL — ABNORMAL HIGH (ref 0.76–1.27)
GFR calc Af Amer: 34 mL/min/{1.73_m2} — ABNORMAL LOW (ref >59)
GFR calc non Af Amer: 29 mL/min/{1.73_m2} — ABNORMAL LOW (ref >59)
Globulin, Total: 2.4 g/dL (ref 1.5–4.5)
Glucose: 102 mg/dL — ABNORMAL HIGH (ref 65–99)
Potassium: 4.5 mmol/L (ref 3.5–5.2)
Sodium: 138 mmol/L (ref 134–144)
Total Protein: 6.9 g/dL (ref 6.0–8.5)

## 2019-08-04 LAB — THYROID PANEL WITH TSH
Free Thyroxine Index: 2.7 (ref 1.2–4.9)
T3 Uptake Ratio: 31 % (ref 24–39)
T4, Total: 8.8 ug/dL (ref 4.5–12.0)
TSH: 3.34 u[IU]/mL (ref 0.450–4.500)

## 2019-08-04 LAB — PARATHYROID HORMONE, INTACT (NO CA): PTH: 41 pg/mL (ref 15–65)

## 2019-09-02 DIAGNOSIS — I25810 Atherosclerosis of coronary artery bypass graft(s) without angina pectoris: Secondary | ICD-10-CM | POA: Diagnosis not present

## 2019-09-02 DIAGNOSIS — I10 Essential (primary) hypertension: Secondary | ICD-10-CM | POA: Diagnosis not present

## 2019-09-02 DIAGNOSIS — I480 Paroxysmal atrial fibrillation: Secondary | ICD-10-CM | POA: Diagnosis not present

## 2019-09-02 DIAGNOSIS — R001 Bradycardia, unspecified: Secondary | ICD-10-CM | POA: Diagnosis not present

## 2019-09-23 DIAGNOSIS — N1832 Chronic kidney disease, stage 3b: Secondary | ICD-10-CM | POA: Diagnosis not present

## 2019-09-23 DIAGNOSIS — I10 Essential (primary) hypertension: Secondary | ICD-10-CM | POA: Diagnosis not present

## 2019-12-28 ENCOUNTER — Other Ambulatory Visit: Payer: Self-pay | Admitting: Family Medicine

## 2019-12-28 DIAGNOSIS — I480 Paroxysmal atrial fibrillation: Secondary | ICD-10-CM

## 2020-01-20 ENCOUNTER — Ambulatory Visit: Payer: Medicare Other | Admitting: Family Medicine

## 2020-01-26 ENCOUNTER — Other Ambulatory Visit: Payer: Self-pay

## 2020-01-26 ENCOUNTER — Encounter: Payer: Self-pay | Admitting: Family Medicine

## 2020-01-26 ENCOUNTER — Ambulatory Visit (INDEPENDENT_AMBULATORY_CARE_PROVIDER_SITE_OTHER): Payer: Medicare Other | Admitting: Family Medicine

## 2020-01-26 VITALS — BP 130/88 | HR 72 | Ht 71.0 in | Wt 208.0 lb

## 2020-01-26 DIAGNOSIS — M1A9XX0 Chronic gout, unspecified, without tophus (tophi): Secondary | ICD-10-CM

## 2020-01-26 DIAGNOSIS — E034 Atrophy of thyroid (acquired): Secondary | ICD-10-CM | POA: Diagnosis not present

## 2020-01-26 DIAGNOSIS — I480 Paroxysmal atrial fibrillation: Secondary | ICD-10-CM

## 2020-01-26 DIAGNOSIS — I10 Essential (primary) hypertension: Secondary | ICD-10-CM

## 2020-01-26 DIAGNOSIS — L309 Dermatitis, unspecified: Secondary | ICD-10-CM

## 2020-01-26 DIAGNOSIS — E7849 Other hyperlipidemia: Secondary | ICD-10-CM | POA: Diagnosis not present

## 2020-01-26 MED ORDER — TRIAMCINOLONE ACETONIDE 0.1 % EX CREA: 1.0000 "application " | TOPICAL_CREAM | Freq: Two times a day (BID) | CUTANEOUS | 1 refills | Status: DC

## 2020-01-26 MED ORDER — LEVOTHYROXINE SODIUM 100 MCG PO TABS: 100.0000 ug | ORAL_TABLET | Freq: Every day | ORAL | 1 refills | Status: DC

## 2020-01-26 MED ORDER — LOVASTATIN 20 MG PO TABS: 20.0000 mg | ORAL_TABLET | Freq: Every day | ORAL | 1 refills | Status: DC

## 2020-01-26 MED ORDER — ALLOPURINOL 100 MG PO TABS: 100.0000 mg | ORAL_TABLET | Freq: Every day | ORAL | 1 refills | Status: DC

## 2020-01-26 NOTE — Progress Notes (Signed)
Date:  01/26/2020   Name:  [name]   DOB:  Sep 13, 1945   MRN:  [id number]   Chief Complaint: Hyperlipidemia, Hypothyroidism, Gout, and Rash (uses triamcinolone cream)  Hyperlipidemia This is a chronic problem. The current episode started more than 1 year ago. The problem is controlled. Recent lipid tests were reviewed and are normal. He has no history of chronic renal disease, diabetes, hypothyroidism, liver disease, obesity or nephrotic syndrome. There are no known factors aggravating his hyperlipidemia. Pertinent negatives include no chest pain, focal sensory loss, focal weakness, leg pain, myalgias or shortness of breath. He is currently on no antihyperlipidemic treatment. The current treatment provides moderate improvement of lipids. There are no compliance problems.  Risk factors for coronary artery disease include dyslipidemia, hypertension and male sex.  Rash This is a chronic problem. The current episode started more than 1 year ago. The problem has been gradually improving since onset. The affected locations include the left foot. The rash is characterized by itchiness, dryness, redness and scaling. Pertinent negatives include no cough, diarrhea, fatigue, fever, shortness of breath or sore throat.  Thyroid Problem Presents for follow-up visit. Patient reports no anxiety, cold intolerance, constipation, depressed mood, diaphoresis, diarrhea, dry skin, fatigue, hair loss, heat intolerance, hoarse voice, leg swelling, menstrual problem, nail problem, palpitations, tremors, visual change, weight gain or weight loss. The symptoms have been stable. His past medical history is significant for hyperlipidemia. There is no history of diabetes.    Lab Results  Component Value Date   CREATININE 2.14 (H) 08/03/2019   BUN 32 (H) 08/03/2019   NA 138 08/03/2019   K 4.5 08/03/2019   CL 102 08/03/2019   CO2 23 08/03/2019   Lab Results  Component Value Date   CHOL 138 08/03/2019   HDL 30  (L) 08/03/2019   LDLCALC 81 08/03/2019   TRIG 157 (H) 08/03/2019   CHOLHDL 4.0 07/03/2017   Lab Results  Component Value Date   TSH 3.340 08/03/2019   No results found for: HGBA1C Lab Results  Component Value Date   WBC 7.6 07/24/2018   HGB 14.0 07/24/2018   HCT 43.7 07/24/2018   MCV 85 07/24/2018   PLT 212 07/24/2018   Lab Results  Component Value Date   ALT 9 08/03/2019   AST 16 08/03/2019   ALKPHOS 79 08/03/2019   BILITOT 0.6 08/03/2019     Review of Systems  Constitutional: Negative for chills, diaphoresis, fatigue, fever, weight gain and weight loss.  HENT: Negative for drooling, ear discharge, ear pain, hoarse voice and sore throat.   Respiratory: Negative for cough, shortness of breath and wheezing.   Cardiovascular: Negative for chest pain, palpitations and leg swelling.  Gastrointestinal: Negative for abdominal pain, blood in stool, constipation, diarrhea and nausea.  Endocrine: Negative for cold intolerance, heat intolerance and polydipsia.  Genitourinary: Negative for dysuria, frequency, hematuria, menstrual problem and urgency.  Musculoskeletal: Negative for back pain, myalgias and neck pain.  Skin: Positive for rash.  Allergic/Immunologic: Negative for environmental allergies.  Neurological: Negative for dizziness, tremors, focal weakness and headaches.  Hematological: Does not bruise/bleed easily.  Psychiatric/Behavioral: Negative for suicidal ideas. The patient is not nervous/anxious.     Patient Active Problem List   Diagnosis Date Noted  . Aortic atherosclerosis (HCC) 07/19/2015  . Anemia of renal disease 07/19/2015  . Heart valve disease 01/18/2015  . 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 Pericarditis associated with severe chronic anemia 07/05/2014  . Coronary atherosclerosis 07/05/2014  .  A-fib (HCC) 07/05/2014  . Gout 07/05/2014  . BP (high blood pressure) 07/05/2014  . Adult hypothyroidism 07/05/2014  . Arthritis  of knee, degenerative 07/05/2014  . Benign essential HTN 05/20/2014    No Known Allergies  Past Surgical History:  Procedure Laterality Date  . APPENDECTOMY    . CARDIAC SURGERY     quad. bypass  . peptic ulcer     removed  . TONSILLECTOMY      Social History   Tobacco Use  . Smoking status: Never Smoker  . Smokeless tobacco: Current User    Types: Chew  Substance Use Topics  . Alcohol use: Yes    Alcohol/week: 0.0 standard drinks  . Drug use: No     Medication list has been reviewed and updated.  Current Meds  Medication Sig  . allopurinol (ZYLOPRIM) 100 MG tablet Take 1 tablet (100 mg total) by mouth daily.  Marland Kitchen aspirin 81 MG tablet Take 1 tablet (81 mg total) by mouth daily.  Marland Kitchen diltiazem (TIAZAC) 120 MG 24 hr capsule Take 1 capsule by mouth daily. kowalski  . levothyroxine (SYNTHROID) 100 MCG tablet Take 1 tablet (100 mcg total) by mouth daily.  Marland Kitchen lovastatin (MEVACOR) 20 MG tablet Take 1 tablet (20 mg total) by mouth daily.  . metoprolol succinate (TOPROL-XL) 50 MG 24 hr tablet TAKE 1 TABLET(50 MG) BY MOUTH DAILY  . olmesartan-hydrochlorothiazide (BENICAR HCT) 40-25 MG tablet Take 1 tablet by mouth daily.  Marland Kitchen triamcinolone cream (KENALOG) 0.1 % Apply 1 application topically 2 (two) times daily.    PHQ 2/9 Scores 01/26/2020 08/03/2019 07/24/2018 01/06/2018  PHQ - 2 Score 0 0 0 0  PHQ- 9 Score 0 0 0 0    GAD 7 : Generalized Anxiety Score 01/26/2020 08/03/2019  Nervous, Anxious, on Edge 0 0  Control/stop worrying 0 0  Worry too much - different things 0 0  Trouble relaxing 0 0  Restless 0 0  Easily annoyed or irritable 0 0  Afraid - awful might happen 0 0  Total GAD 7 Score 0 0    BP Readings from Last 3 Encounters:  01/26/20 130/88  08/03/19 130/70  01/29/19 136/64    Physical Exam Vitals and nursing note reviewed.  HENT:     Head: Normocephalic.     Right Ear: Tympanic membrane, ear canal and external ear normal.     Left Ear: Tympanic membrane, ear  canal and external ear normal.     Nose: Nose normal.  Eyes:     General: No scleral icterus.       Right eye: No discharge.        Left eye: No discharge.     Conjunctiva/sclera: Conjunctivae normal.     Pupils: Pupils are equal, round, and reactive to light.  Neck:     Thyroid: No thyroid mass, thyromegaly or thyroid tenderness.     Vascular: No JVD.     Trachea: No tracheal deviation.  Cardiovascular:     Rate and Rhythm: Normal rate and regular rhythm.     Heart sounds: Normal heart sounds. No murmur heard.  No friction rub. No gallop.   Pulmonary:     Effort: No respiratory distress.     Breath sounds: Normal breath sounds. No stridor. No wheezing, rhonchi or rales.  Chest:     Chest wall: No tenderness.  Abdominal:     General: Bowel sounds are normal.     Palpations: Abdomen is soft. There is no mass.  Tenderness: There is no abdominal tenderness. There is no right CVA tenderness, left CVA tenderness, guarding or rebound.  Musculoskeletal:        General: No tenderness. Normal range of motion.     Cervical back: Normal range of motion and neck supple.  Lymphadenopathy:     Cervical: No cervical adenopathy.     Right cervical: No superficial, deep or posterior cervical adenopathy.    Left cervical: No superficial or deep cervical adenopathy.  Skin:    General: Skin is warm.     Capillary Refill: Capillary refill takes less than 2 seconds.     Coloration: Skin is not jaundiced or pale.     Findings: No bruising, erythema, lesion or rash.  Neurological:     Mental Status: He is alert and oriented to person, place, and time.     Cranial Nerves: No cranial nerve deficit.     Sensory: No sensory deficit.     Motor: No weakness.     Coordination: Coordination normal.     Gait: Gait normal.     Deep Tendon Reflexes: Reflexes are normal and symmetric. Reflexes normal.     Wt Readings from Last 3 Encounters:  01/26/20 208 lb (94.3 kg)  08/03/19 206 lb (93.4 kg)   01/29/19 208 lb (94.3 kg)    BP 130/88   Pulse 72   Ht 5\' 11"  (1.803 m)   Wt 208 lb (94.3 kg)   BMI 29.01 kg/m   Assessment and Plan: 1. Chronic gout without tophus, unspecified cause, unspecified site .  Controlled.  Stable.  Will continue on allopurinol 100 mg a day. - allopurinol (ZYLOPRIM) 100 MG tablet; Take 1 tablet (100 mg total) by mouth daily.  Dispense: 90 tablet; Refill: 1  2. Paroxysmal atrial fibrillation (HCC) Chronic.  Controlled.  Stable.  Review of previous labs are in normal range with current dosing of levothyroxine 100 mcg daily. - levothyroxine (SYNTHROID) 100 MCG tablet; Take 1 tablet (100 mcg total) by mouth daily.  Dispense: 90 tablet; Refill: 1  3. Familial multiple lipoprotein-type hyperlipidemia Chronic.  Controlled.  Stable continue lovastatin 20 mg once a day.  Will check lipid panel for current status - lovastatin (MEVACOR) 20 MG tablet; Take 1 tablet (20 mg total) by mouth daily.  Dispense: 90 tablet; Refill: 1 - Lipid Panel With LDL/HDL Ratio  4. Dermatitis Chronic.  Controlled.  Stable.  This is consistent with eczema on the dorsum of the left foot.  We will continue triamcinolone 0.1% apply twice a day. - triamcinolone (KENALOG) 0.1 %; Apply 1 application topically 2 (two) times daily.  Dispense: 30 g; Refill: 1  5. 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  Controlled.  Stable.  Will continue on current dosing levothyroxine and will check TSH. - TSH  6. Primary hypertension Chronic.  Controlled.  Stable.  Will continue on current dosing of diltiazem examined olmesartan-hydrochlorothiazide.  This is currently being controlled by Dr. Kowalski/cardiology.  Will check renal function panel for electrolytes and current GFR. - Renal Function Panel

## 2020-01-27 LAB — RENAL FUNCTION PANEL
Albumin: 4.3 g/dL (ref 3.7–4.7)
BUN/Creatinine Ratio: 15 (ref 10–24)
BUN: 30 mg/dL — ABNORMAL HIGH (ref 8–27)
CO2: 22 mmol/L (ref 20–29)
Calcium: 9.5 mg/dL (ref 8.6–10.2)
Chloride: 103 mmol/L (ref 96–106)
Creatinine, Ser: 1.97 mg/dL — ABNORMAL HIGH (ref 0.76–1.27)
GFR calc Af Amer: 37 mL/min/{1.73_m2} — ABNORMAL LOW (ref >59)
GFR calc non Af Amer: 32 mL/min/{1.73_m2} — ABNORMAL LOW (ref >59)
Glucose: 103 mg/dL — ABNORMAL HIGH (ref 65–99)
Phosphorus: 2.7 mg/dL — ABNORMAL LOW (ref 2.8–4.1)
Potassium: 4.4 mmol/L (ref 3.5–5.2)
Sodium: 139 mmol/L (ref 134–144)

## 2020-01-27 LAB — LIPID PANEL WITH LDL/HDL RATIO
Cholesterol, Total: 128 mg/dL (ref 100–199)
HDL: 31 mg/dL — ABNORMAL LOW (ref >39)
LDL Chol Calc (NIH): 68 mg/dL (ref 0–99)
LDL/HDL Ratio: 2.2 ratio (ref 0.0–3.6)
Triglycerides: 168 mg/dL — ABNORMAL HIGH (ref 0–149)
VLDL Cholesterol Cal: 29 mg/dL (ref 5–40)

## 2020-01-27 LAB — TSH: TSH: 3.21 u[IU]/mL (ref 0.450–4.500)

## 2020-01-28 ENCOUNTER — Other Ambulatory Visit: Payer: Self-pay | Admitting: Family Medicine

## 2020-01-28 DIAGNOSIS — I10 Essential (primary) hypertension: Secondary | ICD-10-CM

## 2020-01-29 ENCOUNTER — Other Ambulatory Visit: Payer: Self-pay | Admitting: Family Medicine

## 2020-01-29 DIAGNOSIS — I10 Essential (primary) hypertension: Secondary | ICD-10-CM

## 2020-02-11 ENCOUNTER — Other Ambulatory Visit: Payer: Self-pay

## 2020-02-11 ENCOUNTER — Telehealth: Payer: Self-pay

## 2020-02-11 MED ORDER — INDOMETHACIN 50 MG PO CAPS: 50.0000 mg | ORAL_CAPSULE | Freq: Three times a day (TID) | ORAL | 0 refills | Status: DC

## 2020-02-11 NOTE — Telephone Encounter (Signed)
Copied from CRM [id number]. Topic: Quick Communication - See Telephone Encounter >> Feb 11, 2020  8:11 AM Aretta Nip wrote: CRM for notification. See Telephone encounter for: 02/11/20. Pt feels has had a gout flare up and wants Delice Bison to give CB and see if Dr Yetta Barre could send him in something before holiday. Please return call [phone]

## 2020-02-11 NOTE — Telephone Encounter (Signed)
Called and spoke with patient. He is having pain and swelling in his Rt knee. Cannot bear weight on his right leg, or even walk from the bed to the bathroom. Having to use the bathroom next to the bed in a "pot."   Spoke with Dr. Yetta Barre. Sent in Indomethacin 50 mg TID #30 with 0 Rfs. Made patient an appt for Monday to follow up for Rt Knee Pain. He said he will be here to follow up.

## 2020-02-15 ENCOUNTER — Other Ambulatory Visit: Payer: Self-pay

## 2020-02-15 ENCOUNTER — Ambulatory Visit (INDEPENDENT_AMBULATORY_CARE_PROVIDER_SITE_OTHER): Payer: Medicare Other | Admitting: Family Medicine

## 2020-02-15 ENCOUNTER — Encounter: Payer: Self-pay | Admitting: Family Medicine

## 2020-02-15 VITALS — BP 156/76 | HR 57 | Ht 71.0 in | Wt 209.0 lb

## 2020-02-15 DIAGNOSIS — M10061 Idiopathic gout, right knee: Secondary | ICD-10-CM | POA: Diagnosis not present

## 2020-02-15 NOTE — Progress Notes (Signed)
Date:  02/15/2020   Name:  [name]   DOB:  01-06-1946   MRN:  [id number]   Chief Complaint: Knee Pain (Right knee pain follow up. Patient was prescribed indomethacin Thursday. Patient is doing better today. Could not walk on leg Thursday.)  Knee Pain  There was no injury mechanism. The pain is present in the right knee. The quality of the pain is described as aching. The pain is at a severity of 9/10. The pain is severe. The pain has been improving since onset. Pertinent negatives include no inability to bear weight, loss of motion, loss of sensation, muscle weakness, numbness or tingling. The symptoms are aggravated by palpation. The treatment provided mild relief.    Lab Results  Component Value Date   CREATININE 1.97 (H) 01/26/2020   BUN 30 (H) 01/26/2020   NA 139 01/26/2020   K 4.4 01/26/2020   CL 103 01/26/2020   CO2 22 01/26/2020   Lab Results  Component Value Date   CHOL 128 01/26/2020   HDL 31 (L) 01/26/2020   LDLCALC 68 01/26/2020   TRIG 168 (H) 01/26/2020   CHOLHDL 4.0 07/03/2017   Lab Results  Component Value Date   TSH 3.210 01/26/2020   No results found for: HGBA1C Lab Results  Component Value Date   WBC 7.6 07/24/2018   HGB 14.0 07/24/2018   HCT 43.7 07/24/2018   MCV 85 07/24/2018   PLT 212 07/24/2018   Lab Results  Component Value Date   ALT 9 08/03/2019   AST 16 08/03/2019   ALKPHOS 79 08/03/2019   BILITOT 0.6 08/03/2019     Review of Systems  Constitutional: Negative for chills and fever.  HENT: Negative for drooling, ear discharge, ear pain and sore throat.   Respiratory: Negative for cough, shortness of breath and wheezing.   Cardiovascular: Negative for chest pain, palpitations and leg swelling.  Gastrointestinal: Negative for abdominal pain, blood in stool, constipation, diarrhea and nausea.  Endocrine: Negative for polydipsia.  Genitourinary: Negative for dysuria, frequency, hematuria and urgency.  Musculoskeletal: Negative  for back pain, myalgias and neck pain.  Skin: Negative for rash.  Allergic/Immunologic: Negative for environmental allergies.  Neurological: Negative for dizziness, tingling, numbness and headaches.  Hematological: Does not bruise/bleed easily.  Psychiatric/Behavioral: Negative for suicidal ideas. The patient is not nervous/anxious.     Patient Active Problem List   Diagnosis Date Noted  . Aortic atherosclerosis (HCC) 07/19/2015  . Anemia of renal disease 07/19/2015  . Heart valve disease 01/18/2015  . 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 Pericarditis associated with severe chronic anemia 07/05/2014  . Coronary atherosclerosis 07/05/2014  . A-fib (HCC) 07/05/2014  . Gout 07/05/2014  . BP (high blood pressure) 07/05/2014  . Adult hypothyroidism 07/05/2014  . Arthritis of knee, degenerative 07/05/2014  . Benign essential HTN 05/20/2014    No Known Allergies  Past Surgical History:  Procedure Laterality Date  . APPENDECTOMY    . CARDIAC SURGERY     quad. bypass  . peptic ulcer     removed  . TONSILLECTOMY      Social History   Tobacco Use  . Smoking status: Never Smoker  . Smokeless tobacco: Current User    Types: Chew  Substance Use Topics  . Alcohol use: Yes    Alcohol/week: 0.0 standard drinks  . Drug use: No     Medication list has been reviewed and updated.  Current Meds  Medication Sig  . allopurinol (ZYLOPRIM)  100 MG tablet Take 1 tablet (100 mg total) by mouth daily.  Marland Kitchen aspirin 81 MG tablet Take 1 tablet (81 mg total) by mouth daily.  . indomethacin (INDOCIN) 50 MG capsule Take 1 capsule (50 mg total) by mouth 3 (three) times daily with meals.  Marland Kitchen levothyroxine (SYNTHROID) 100 MCG tablet Take 1 tablet (100 mcg total) by mouth daily.  Marland Kitchen lovastatin (MEVACOR) 20 MG tablet Take 1 tablet (20 mg total) by mouth daily.  . metoprolol succinate (TOPROL-XL) 50 MG 24 hr tablet TAKE 1 TABLET BY MOUTH EVERY DAY  .  olmesartan-hydrochlorothiazide (BENICAR HCT) 40-25 MG tablet TAKE 1 TABLET BY MOUTH EVERY DAY  . triamcinolone (KENALOG) 0.1 % Apply 1 application topically 2 (two) times daily.    PHQ 2/9 Scores 01/26/2020 08/03/2019 07/24/2018 01/06/2018  PHQ - 2 Score 0 0 0 0  PHQ- 9 Score 0 0 0 0    GAD 7 : Generalized Anxiety Score 01/26/2020 08/03/2019  Nervous, Anxious, on Edge 0 0  Control/stop worrying 0 0  Worry too much - different things 0 0  Trouble relaxing 0 0  Restless 0 0  Easily annoyed or irritable 0 0  Afraid - awful might happen 0 0  Total GAD 7 Score 0 0    BP Readings from Last 3 Encounters:  02/15/20 (!) 156/76  01/26/20 130/88  08/03/19 130/70    Physical Exam Nursing note reviewed.  HENT:     Head: Normocephalic.     Right Ear: External ear normal.     Left Ear: External ear normal.     Nose: Nose normal. No congestion or rhinorrhea.     Mouth/Throat:     Mouth: Oropharynx is clear and moist. Mucous membranes are moist.  Eyes:     General: No scleral icterus.       Right eye: No discharge.        Left eye: No discharge.     Extraocular Movements: EOM normal.     Conjunctiva/sclera: Conjunctivae normal.     Pupils: Pupils are equal, round, and reactive to light.  Neck:     Thyroid: No thyromegaly.     Vascular: No JVD.     Trachea: No tracheal deviation.  Cardiovascular:     Rate and Rhythm: Normal rate and regular rhythm.     Pulses: Intact distal pulses.     Heart sounds: Normal heart sounds. No murmur heard. No friction rub. No gallop.   Pulmonary:     Breath sounds: Normal breath sounds. No wheezing or rhonchi.  Abdominal:     General: Bowel sounds are normal.     Palpations: Abdomen is soft. There is no hepatosplenomegaly or mass.     Tenderness: There is no abdominal tenderness. There is no CVA tenderness, guarding or rebound.  Musculoskeletal:        General: No tenderness or edema. Normal range of motion.     Cervical back: Normal range of motion  and neck supple.     Right knee: Swelling and effusion present. No deformity, erythema, ecchymosis, lacerations or bony tenderness. Normal range of motion. No tenderness.  Lymphadenopathy:     Cervical: No cervical adenopathy.  Skin:    General: Skin is warm.     Findings: No rash.  Neurological:     Mental Status: He is alert and oriented to person, place, and time.     Cranial Nerves: No cranial nerve deficit.     Deep Tendon Reflexes: Strength normal  and reflexes are normal and symmetric.     Wt Readings from Last 3 Encounters:  02/15/20 209 lb (94.8 kg)  01/26/20 208 lb (94.3 kg)  08/03/19 206 lb (93.4 kg)    BP (!) 156/76   Pulse (!) 57   Ht 5\' 11"  (1.803 m)   Wt 209 lb (94.8 kg)   SpO2 98%   BMI 29.15 kg/m   Assessment and Plan: 1. Acute idiopathic gout of right knee Patient is follow-up acute gout attack over the previous 3 days.  Patient was prescribed indomethacin 50 mg and took it sparingly and had immediate relief of pain.  Patient does have a history of renal insufficiency in which is followed by nephrology for stage IIIb chronic kidney disease.  Patient is doing well with this and we will discontinue his NSAIDs tomorrow after rechecking uric acid.  We may be either increasing uric acid to 200 mg or adding an additional 100 mg whenever fresh seafood is consumed. - Uric acid

## 2020-02-15 NOTE — Patient Instructions (Signed)

## 2020-02-16 LAB — URIC ACID: Uric Acid: 7.4 mg/dL (ref 3.8–8.4)

## 2020-05-31 DIAGNOSIS — I10 Essential (primary) hypertension: Secondary | ICD-10-CM | POA: Diagnosis not present

## 2020-05-31 DIAGNOSIS — E782 Mixed hyperlipidemia: Secondary | ICD-10-CM | POA: Diagnosis not present

## 2020-05-31 DIAGNOSIS — I25810 Atherosclerosis of coronary artery bypass graft(s) without angina pectoris: Secondary | ICD-10-CM | POA: Diagnosis not present

## 2020-05-31 DIAGNOSIS — I480 Paroxysmal atrial fibrillation: Secondary | ICD-10-CM | POA: Diagnosis not present

## 2020-06-27 ENCOUNTER — Telehealth: Payer: Self-pay | Admitting: Family Medicine

## 2020-06-27 NOTE — Telephone Encounter (Signed)
Patient declined the Medicare Wellness Visit with [facility] - stated he did not feel he needed to have this completed due to seeing the doctor

## 2020-07-24 ENCOUNTER — Other Ambulatory Visit: Payer: Self-pay | Admitting: Family Medicine

## 2020-07-24 DIAGNOSIS — I10 Essential (primary) hypertension: Secondary | ICD-10-CM

## 2020-07-24 DIAGNOSIS — E7849 Other hyperlipidemia: Secondary | ICD-10-CM

## 2020-07-24 DIAGNOSIS — I480 Paroxysmal atrial fibrillation: Secondary | ICD-10-CM

## 2020-07-24 DIAGNOSIS — M1A9XX0 Chronic gout, unspecified, without tophus (tophi): Secondary | ICD-10-CM

## 2020-07-24 NOTE — Telephone Encounter (Signed)
Requested Prescriptions  Pending Prescriptions Disp Refills  . lovastatin (MEVACOR) 20 MG tablet [Pharmacy Med Name: LOVASTATIN 20 MG TABLET] 90 tablet 1    Sig: TAKE 1 TABLET BY MOUTH EVERY DAY     Cardiovascular:  Antilipid - Statins Failed - 07/24/2020 12:54 AM      Failed - HDL in normal range and within 360 days    HDL Cholesterol  Date Value Ref Range Status  02/21/2013 22 (L) 40 - 60 mg/dL Final   HDL  Date Value Ref Range Status  01/26/2020 31 (L) >39 mg/dL Final         Failed - Triglycerides in normal range and within 360 days    Triglycerides  Date Value Ref Range Status  01/26/2020 168 (H) 0 - 149 mg/dL Final  30/16/0109 323 0 - 200 mg/dL Final         Passed - Total Cholesterol in normal range and within 360 days    Cholesterol, Total  Date Value Ref Range Status  01/26/2020 128 100 - 199 mg/dL Final   Cholesterol  Date Value Ref Range Status  02/21/2013 107 0 - 200 mg/dL Final         Passed - LDL in normal range and within 360 days    Ldl Cholesterol, Calc  Date Value Ref Range Status  02/21/2013 57 0 - 100 mg/dL Final   LDL Chol Calc (NIH)  Date Value Ref Range Status  01/26/2020 68 0 - 99 mg/dL Final         Passed - Patient is not pregnant      Passed - Valid encounter within last 12 months    Recent Outpatient Visits          5 months ago Acute idiopathic gout of right knee   [facility] Duanne Limerick, MD   6 months ago Hypothyroidism due to acquired atrophy of thyroid   [facility] Duanne Limerick, MD   11 months ago Chronic kidney disease (CKD) stage G3b/A3, moderately decreased glomerular filtration rate (GFR) between 30-44 mL/min/1.73 square meter and albuminuria creatinine ratio greater than 300 mg/g   [facility] Duanne Limerick, MD   1 year ago Essential hypertension   [facility] Duanne Limerick, MD   2 years ago Essential hypertension   [facility] Duanne Limerick, MD      Future  Appointments            In 3 days Duanne Limerick, MD [facility]           . allopurinol (ZYLOPRIM) 100 MG tablet [Pharmacy Med Name: ALLOPURINOL 100 MG TABLET] 90 tablet 1    Sig: TAKE 1 TABLET BY MOUTH EVERY DAY     Endocrinology:  Gout Agents Failed - 07/24/2020 12:54 AM      Failed - Cr in normal range and within 360 days    Creatinine  Date Value Ref Range Status  02/21/2013 2.12 (H) 0.60 - 1.30 mg/dL Final   Creatinine, Ser  Date Value Ref Range Status  01/26/2020 1.97 (H) 0.76 - 1.27 mg/dL Final         Passed - Uric Acid in normal range and within 360 days    Uric Acid  Date Value Ref Range Status  02/15/2020 7.4 3.8 - 8.4 mg/dL Final    Comment:               Therapeutic  target for gout patients: <6.0         Passed - Valid encounter within last 12 months    Recent Outpatient Visits          5 months ago Acute idiopathic gout of right knee   [facility] Duanne Limerick, MD   6 months ago Hypothyroidism due to acquired atrophy of thyroid   [facility] Duanne Limerick, MD   11 months ago Chronic kidney disease (CKD) stage G3b/A3, moderately decreased glomerular filtration rate (GFR) between 30-44 mL/min/1.73 square meter and albuminuria creatinine ratio greater than 300 mg/g   [facility] Duanne Limerick, MD   1 year ago Essential hypertension   [facility] Duanne Limerick, MD   2 years ago Essential hypertension   [facility] Duanne Limerick, MD      Future Appointments            In 3 days Duanne Limerick, MD [facility], [facility]           . levothyroxine (SYNTHROID) 100 MCG tablet [Pharmacy Med Name: LEVOTHYROXINE 100 MCG TABLET] 90 tablet 1    Sig: TAKE 1 TABLET BY MOUTH EVERY DAY     Endocrinology:  Hypothyroid Agents Failed - 07/24/2020 12:54 AM      Failed - TSH needs to be rechecked within 3 months after an abnormal result. Refill until TSH is due.      Passed - TSH in  normal range and within 360 days    TSH  Date Value Ref Range Status  01/26/2020 3.210 0.450 - 4.500 uIU/mL Final         Passed - Valid encounter within last 12 months    Recent Outpatient Visits          5 months ago Acute idiopathic gout of right knee   [facility] Duanne Limerick, MD   6 months ago Hypothyroidism due to acquired atrophy of thyroid   [facility] Duanne Limerick, MD   11 months ago Chronic kidney disease (CKD) stage G3b/A3, moderately decreased glomerular filtration rate (GFR) between 30-44 mL/min/1.73 square meter and albuminuria creatinine ratio greater than 300 mg/g   [facility] Duanne Limerick, MD   1 year ago Essential hypertension   [facility] Duanne Limerick, MD   2 years ago Essential hypertension   [facility] Duanne Limerick, MD      Future Appointments            In 3 days Duanne Limerick, MD [facility], [facility]           . olmesartan-hydrochlorothiazide (BENICAR HCT) 40-25 MG tablet [Pharmacy Med Name: OLMESARTAN-HCTZ 40-25 MG TAB] 90 tablet 0    Sig: TAKE 1 TABLET BY MOUTH EVERY DAY     Cardiovascular: ARB + Diuretic Combos Failed - 07/24/2020 12:54 AM      Failed - Cr in normal range and within 180 days    Creatinine  Date Value Ref Range Status  02/21/2013 2.12 (H) 0.60 - 1.30 mg/dL Final   Creatinine, Ser  Date Value Ref Range Status  01/26/2020 1.97 (H) 0.76 - 1.27 mg/dL Final         Failed - Last BP in normal range    BP Readings from Last 1 Encounters:  02/15/20 (!) 156/76         Passed - K in normal range  and within 180 days    Potassium  Date Value Ref Range Status  01/26/2020 4.4 3.5 - 5.2 mmol/L Final  02/21/2013 3.8 3.5 - 5.1 mmol/L Final         Passed - Na in normal range and within 180 days    Sodium  Date Value Ref Range Status  01/26/2020 139 134 - 144 mmol/L Final  02/21/2013 131 (L) 136 - 145 mmol/L Final         Passed - Ca in normal range and  within 180 days    Calcium  Date Value Ref Range Status  01/26/2020 9.5 8.6 - 10.2 mg/dL Final   Calcium, Total  Date Value Ref Range Status  02/21/2013 8.4 (L) 8.5 - 10.1 mg/dL Final         Passed - Patient is not pregnant      Passed - Valid encounter within last 6 months    Recent Outpatient Visits          5 months ago Acute idiopathic gout of right knee   [facility] Duanne Limerick, MD   6 months ago Hypothyroidism due to acquired atrophy of thyroid   [facility] Duanne Limerick, MD   11 months ago Chronic kidney disease (CKD) stage G3b/A3, moderately decreased glomerular filtration rate (GFR) between 30-44 mL/min/1.73 square meter and albuminuria creatinine ratio greater than 300 mg/g   [facility] Duanne Limerick, MD   1 year ago Essential hypertension   [facility] Duanne Limerick, MD   2 years ago Essential hypertension   [facility] Duanne Limerick, MD      Future Appointments            In 3 days Duanne Limerick, MD [facility]           . metoprolol succinate (TOPROL-XL) 50 MG 24 hr tablet [Pharmacy Med Name: METOPROLOL SUCC ER 50 MG TAB] 90 tablet 0    Sig: TAKE 1 TABLET BY MOUTH EVERY DAY     Cardiovascular:  Beta Blockers Failed - 07/24/2020 12:54 AM      Failed - Last BP in normal range    BP Readings from Last 1 Encounters:  02/15/20 (!) 156/76         Passed - Last Heart Rate in normal range    Pulse Readings from Last 1 Encounters:  02/15/20 (!) 57         Passed - Valid encounter within last 6 months    Recent Outpatient Visits          5 months ago Acute idiopathic gout of right knee   [facility] Duanne Limerick, MD   6 months ago Hypothyroidism due to acquired atrophy of thyroid   [facility] Duanne Limerick, MD   11 months ago Chronic kidney disease (CKD) stage G3b/A3, moderately decreased glomerular filtration rate (GFR) between 30-44 mL/min/1.73  square meter and albuminuria creatinine ratio greater than 300 mg/g   [facility] Duanne Limerick, MD   1 year ago Essential hypertension   [facility] Duanne Limerick, MD   2 years ago Essential hypertension   [facility] Duanne Limerick, MD      Future Appointments            In 3 days Duanne Limerick, MD [facility], [facility]

## 2020-07-27 ENCOUNTER — Encounter: Payer: Self-pay | Admitting: Family Medicine

## 2020-07-27 ENCOUNTER — Other Ambulatory Visit: Payer: Self-pay

## 2020-07-27 ENCOUNTER — Other Ambulatory Visit: Payer: Self-pay | Admitting: Family Medicine

## 2020-07-27 ENCOUNTER — Ambulatory Visit (INDEPENDENT_AMBULATORY_CARE_PROVIDER_SITE_OTHER): Payer: Medicare Other | Admitting: Family Medicine

## 2020-07-27 VITALS — BP 138/70 | HR 76 | Ht 71.0 in | Wt 200.0 lb

## 2020-07-27 DIAGNOSIS — E039 Hypothyroidism, unspecified: Secondary | ICD-10-CM | POA: Diagnosis not present

## 2020-07-27 DIAGNOSIS — I480 Paroxysmal atrial fibrillation: Secondary | ICD-10-CM | POA: Diagnosis not present

## 2020-07-27 DIAGNOSIS — E7849 Other hyperlipidemia: Secondary | ICD-10-CM

## 2020-07-27 DIAGNOSIS — I10 Essential (primary) hypertension: Secondary | ICD-10-CM | POA: Diagnosis not present

## 2020-07-27 DIAGNOSIS — M1A9XX0 Chronic gout, unspecified, without tophus (tophi): Secondary | ICD-10-CM | POA: Diagnosis not present

## 2020-07-27 MED ORDER — LOVASTATIN 20 MG PO TABS: 20.0000 mg | ORAL_TABLET | Freq: Every day | ORAL | 1 refills | Status: DC

## 2020-07-27 MED ORDER — LEVOTHYROXINE SODIUM 100 MCG PO TABS
100.0000 ug | ORAL_TABLET | Freq: Every day | ORAL | 1 refills | Status: DC
Start: 2020-07-27 — End: 2021-01-26

## 2020-07-27 MED ORDER — METOPROLOL SUCCINATE ER 50 MG PO TB24: 50.0000 mg | ORAL_TABLET | Freq: Every day | ORAL | 0 refills | Status: DC

## 2020-07-27 MED ORDER — OLMESARTAN MEDOXOMIL-HCTZ 40-25 MG PO TABS: 1.0000 | ORAL_TABLET | Freq: Every day | ORAL | 1 refills | Status: DC

## 2020-07-27 MED ORDER — ALLOPURINOL 100 MG PO TABS: 100.0000 mg | ORAL_TABLET | Freq: Every day | ORAL | 1 refills | Status: DC

## 2020-07-27 NOTE — Progress Notes (Signed)
Date:  07/27/2020   Name:  [name]   DOB:  [birth year]   MRN:  [id number]   Chief Complaint: Hypertension, Hypothyroidism, Hyperlipidemia, and Gout  .d  Hypertension This is a chronic problem. The current episode started more than 1 year ago. The problem has been waxing and waning since onset. The problem is controlled. Pertinent negatives include no anxiety, blurred vision, chest pain, headaches, malaise/fatigue, neck pain, orthopnea, palpitations, peripheral edema, PND, shortness of breath or sweats. There are no associated agents to hypertension. Risk factors for coronary artery disease include dyslipidemia and diabetes mellitus. Past treatments include beta blockers, angiotensin blockers, calcium channel blockers and diuretics. The current treatment provides moderate improvement. There are no compliance problems.  There is no history of angina, kidney disease, CAD/MI, CVA, heart failure, left ventricular hypertrophy, PVD or retinopathy. Identifiable causes of hypertension include a thyroid problem. There is no history of chronic renal disease, a hypertension causing med or renovascular disease.  Hyperlipidemia This is a chronic problem. The current episode started more than 1 year ago. The problem is controlled. Recent lipid tests were reviewed and are normal. Exacerbating diseases include hypothyroidism. He has no history of chronic renal disease, diabetes, liver disease, obesity or nephrotic syndrome. Factors aggravating his hyperlipidemia include thiazides. Pertinent negatives include no chest pain, myalgias or shortness of breath. Current antihyperlipidemic treatment includes statins. The current treatment provides moderate improvement of lipids. There are no compliance problems.  Risk factors for coronary artery disease include diabetes mellitus and dyslipidemia.  Thyroid Problem Presents for follow-up visit. Patient reports no anxiety, cold intolerance, constipation, depressed mood,  diaphoresis, diarrhea, dry skin, fatigue, hair loss, heat intolerance, hoarse voice, leg swelling, menstrual problem, nail problem, palpitations, tremors, visual change, weight gain or weight loss. The symptoms have been stable. His past medical history is significant for hyperlipidemia. There is no history of diabetes or heart failure.    Lab Results  Component Value Date   CREATININE 1.97 (H) 01/26/2020   BUN 30 (H) 01/26/2020   NA 139 01/26/2020   K 4.4 01/26/2020   CL 103 01/26/2020   CO2 22 01/26/2020   Lab Results  Component Value Date   CHOL 128 01/26/2020   HDL 31 (L) 01/26/2020   LDLCALC 68 01/26/2020   TRIG 168 (H) 01/26/2020   CHOLHDL 4.0 07/03/2017   Lab Results  Component Value Date   TSH 3.210 01/26/2020   No results found for: HGBA1C Lab Results  Component Value Date   WBC 7.6 07/24/2018   HGB 14.0 07/24/2018   HCT 43.7 07/24/2018   MCV 85 07/24/2018   PLT 212 07/24/2018   Lab Results  Component Value Date   ALT 9 08/03/2019   AST 16 08/03/2019   ALKPHOS 79 08/03/2019   BILITOT 0.6 08/03/2019     Review of Systems  Constitutional: Negative for chills, diaphoresis, fatigue, fever, malaise/fatigue, weight gain and weight loss.  HENT: Negative for drooling, ear discharge, ear pain, hoarse voice and sore throat.   Eyes: Negative for blurred vision.  Respiratory: Negative for cough, shortness of breath and wheezing.   Cardiovascular: Negative for chest pain, palpitations, orthopnea, leg swelling and PND.  Gastrointestinal: Negative for abdominal pain, blood in stool, constipation, diarrhea and nausea.  Endocrine: Negative for cold intolerance, heat intolerance and polydipsia.  Genitourinary: Negative for dysuria, frequency, hematuria, menstrual problem and urgency.  Musculoskeletal: Negative for back pain, myalgias and neck pain.  Skin: Negative for rash.  Allergic/Immunologic: Negative for  environmental allergies.  Neurological: Negative for dizziness,  tremors and headaches.  Hematological: Does not bruise/bleed easily.  Psychiatric/Behavioral: Negative for suicidal ideas. The patient is not nervous/anxious.     Patient Active Problem List   Diagnosis Date Noted  . Aortic atherosclerosis (HCC) 07/19/2015  . Anemia of renal disease 07/19/2015  . Heart valve disease 01/18/2015  . 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 Pericarditis associated with severe chronic anemia 07/05/2014  . Coronary atherosclerosis 07/05/2014  . A-fib (HCC) 07/05/2014  . Gout 07/05/2014  . BP (high blood pressure) 07/05/2014  . Adult hypothyroidism 07/05/2014  . Arthritis of knee, degenerative 07/05/2014  . Benign essential HTN 05/20/2014    No Known Allergies  Past Surgical History:  Procedure Laterality Date  . APPENDECTOMY    . CARDIAC SURGERY     quad. bypass  . peptic ulcer     removed  . TONSILLECTOMY      Social History   Tobacco Use  . Smoking status: Never Smoker  . Smokeless tobacco: Current User    Types: Chew  Substance Use Topics  . Alcohol use: Yes    Alcohol/week: 0.0 standard drinks  . Drug use: No     Medication list has been reviewed and updated.  Current Meds  Medication Sig  . allopurinol (ZYLOPRIM) 100 MG tablet TAKE 1 TABLET BY MOUTH EVERY DAY  . aspirin 81 MG tablet Take 1 tablet (81 mg total) by mouth daily.  Marland Kitchen diltiazem (TIAZAC) 120 MG 24 hr capsule Take 1 capsule by mouth daily. kowalski  . levothyroxine (SYNTHROID) 100 MCG tablet TAKE 1 TABLET BY MOUTH EVERY DAY  . lovastatin (MEVACOR) 20 MG tablet TAKE 1 TABLET BY MOUTH EVERY DAY  . metoprolol succinate (TOPROL-XL) 50 MG 24 hr tablet TAKE 1 TABLET BY MOUTH EVERY DAY  . olmesartan-hydrochlorothiazide (BENICAR HCT) 40-25 MG tablet TAKE 1 TABLET BY MOUTH EVERY DAY  . triamcinolone (KENALOG) 0.1 % Apply 1 application topically 2 (two) times daily.    PHQ 2/9 Scores 07/27/2020 01/26/2020 08/03/2019 07/24/2018  PHQ - 2 Score 0 0 0 0  PHQ- 9  Score 0 0 0 0    GAD 7 : Generalized Anxiety Score 07/27/2020 01/26/2020 08/03/2019  Nervous, Anxious, on Edge 0 0 0  Control/stop worrying 0 0 0  Worry too much - different things 0 0 0  Trouble relaxing 0 0 0  Restless 0 0 0  Easily annoyed or irritable 0 0 0  Afraid - awful might happen 0 0 0  Total GAD 7 Score 0 0 0    BP Readings from Last 3 Encounters:  07/27/20 138/70  02/15/20 (!) 156/76  01/26/20 130/88    Physical Exam Vitals and nursing note reviewed.  HENT:     Head: Normocephalic.     Right Ear: Tympanic membrane, ear canal and external ear normal.     Left Ear: Tympanic membrane, ear canal and external ear normal.     Nose: Nose normal. No congestion or rhinorrhea.     Mouth/Throat:     Mouth: Mucous membranes are moist.  Eyes:     General: No scleral icterus.       Right eye: No discharge.        Left eye: No discharge.     Conjunctiva/sclera: Conjunctivae normal.     Pupils: Pupils are equal, round, and reactive to light.  Neck:     Thyroid: No thyromegaly.     Vascular: No JVD.  Trachea: No tracheal deviation.  Cardiovascular:     Rate and Rhythm: Normal rate and regular rhythm.     Heart sounds: Normal heart sounds. No murmur heard. No friction rub. No gallop.   Pulmonary:     Effort: No respiratory distress.     Breath sounds: Normal breath sounds. No wheezing, rhonchi or rales.  Abdominal:     General: Bowel sounds are normal.     Palpations: Abdomen is soft. There is no mass.     Tenderness: There is no abdominal tenderness. There is no guarding or rebound.  Musculoskeletal:        General: No tenderness. Normal range of motion.     Cervical back: Normal range of motion and neck supple.  Lymphadenopathy:     Cervical: No cervical adenopathy.  Skin:    General: Skin is warm.     Coloration: Skin is not jaundiced or pale.     Findings: No bruising, erythema, lesion or rash.  Neurological:     Mental Status: He is alert and oriented to  person, place, and time.     Cranial Nerves: No cranial nerve deficit.     Deep Tendon Reflexes: Reflexes are normal and symmetric.  Psychiatric:        Mood and Affect: Mood normal.     Wt Readings from Last 3 Encounters:  07/27/20 200 lb (90.7 kg)  02/15/20 209 lb (94.8 kg)  01/26/20 208 lb (94.3 kg)    BP 138/70   Pulse 76   Ht 5\' 11"  (1.803 m)   Wt 200 lb (90.7 kg)   BMI 27.89 kg/m   Assessment and Plan:  1. Essential hypertension Chronic.  Controlled.  Stable.  Blood pressure 138/70.  Continue Toprol-XL 50 mg and olmesartan hydrochlorothiazide 40-25 mg once a day.  Check CMP for electrolytes and GFR. - metoprolol succinate (TOPROL-XL) 50 MG 24 hr tablet; Take 1 tablet (50 mg total) by mouth daily. Take with or immediately following a meal.  Dispense: 90 tablet; Refill: 0 - olmesartan-hydrochlorothiazide (BENICAR HCT) 40-25 MG tablet; Take 1 tablet by mouth daily.  Dispense: 90 tablet; Refill: 1 - Comprehensive Metabolic Panel (CMET)  2. Hypothyroidism, unspecified type Chronic.  Controlled.  Stable.  Will check thyroid panel with TSH and if stable will continue on levothyroxine supplementation. - Thyroid Panel With TSH  3. Familial multiple lipoprotein-type hyperlipidemia Chronic.  Controlled we will check lipid panel and will likely continue lovastatin 20 mg once a day. - lovastatin (MEVACOR) 20 MG tablet; Take 1 tablet (20 mg total) by mouth daily.  Dispense: 90 tablet; Refill: 1 - Lipid Panel With LDL/HDL Ratio  4. Chronic gout without tophus, unspecified cause, unspecified site Chronic.  Controlled.  Stable.  Continue allopurinol 100 mg. - allopurinol (ZYLOPRIM) 100 MG tablet; Take 1 tablet (100 mg total) by mouth daily.  Dispense: 90 tablet; Refill: 1  5. Paroxysmal atrial fibrillation (HCC) Followed by Dr. . - levothyroxine (SYNTHROID) 100 MCG tablet; Take 1 tablet (100 mcg total) by mouth daily.  Dispense: 90 tablet; Refill: 1

## 2020-07-28 ENCOUNTER — Encounter: Payer: Self-pay | Admitting: Family Medicine

## 2020-07-28 LAB — LIPID PANEL WITH LDL/HDL RATIO
Cholesterol, Total: 119 mg/dL (ref 100–199)
HDL: 33 mg/dL — ABNORMAL LOW (ref >39)
LDL Chol Calc (NIH): 61 mg/dL (ref 0–99)
LDL/HDL Ratio: 1.8 ratio (ref 0.0–3.6)
Triglycerides: 140 mg/dL (ref 0–149)
VLDL Cholesterol Cal: 25 mg/dL (ref 5–40)

## 2020-07-28 LAB — THYROID PANEL WITH TSH
Free Thyroxine Index: 2.7 (ref 1.2–4.9)
T3 Uptake Ratio: 31 % (ref 24–39)
T4, Total: 8.7 ug/dL (ref 4.5–12.0)
TSH: 2.77 u[IU]/mL (ref 0.450–4.500)

## 2020-07-28 LAB — COMPREHENSIVE METABOLIC PANEL
ALT: 12 IU/L (ref 0–44)
AST: 17 IU/L (ref 0–40)
Albumin/Globulin Ratio: 1.7 (ref 1.2–2.2)
Albumin: 4.1 g/dL (ref 3.7–4.7)
Alkaline Phosphatase: 74 IU/L (ref 44–121)
BUN/Creatinine Ratio: 14 (ref 10–24)
BUN: 28 mg/dL — ABNORMAL HIGH (ref 8–27)
Bilirubin Total: 0.7 mg/dL (ref 0.0–1.2)
CO2: 19 mmol/L — ABNORMAL LOW (ref 20–29)
Calcium: 9.1 mg/dL (ref 8.6–10.2)
Chloride: 103 mmol/L (ref 96–106)
Creatinine, Ser: 1.98 mg/dL — ABNORMAL HIGH (ref 0.76–1.27)
Globulin, Total: 2.4 g/dL (ref 1.5–4.5)
Glucose: 89 mg/dL (ref 65–99)
Potassium: 4.7 mmol/L (ref 3.5–5.2)
Sodium: 141 mmol/L (ref 134–144)
Total Protein: 6.5 g/dL (ref 6.0–8.5)
eGFR: 34 mL/min/{1.73_m2} — ABNORMAL LOW (ref >59)

## 2020-08-10 DIAGNOSIS — N1832 Chronic kidney disease, stage 3b: Secondary | ICD-10-CM | POA: Diagnosis not present

## 2020-08-10 DIAGNOSIS — E872 Acidosis: Secondary | ICD-10-CM | POA: Diagnosis not present

## 2020-08-10 DIAGNOSIS — I10 Essential (primary) hypertension: Secondary | ICD-10-CM | POA: Diagnosis not present

## 2020-09-20 ENCOUNTER — Other Ambulatory Visit: Payer: Self-pay | Admitting: Family Medicine

## 2020-10-12 ENCOUNTER — Other Ambulatory Visit: Payer: Self-pay | Admitting: Family Medicine

## 2020-10-12 DIAGNOSIS — L309 Dermatitis, unspecified: Secondary | ICD-10-CM

## 2020-10-12 NOTE — Telephone Encounter (Signed)
Requested Prescriptions  Pending Prescriptions Disp Refills  . triamcinolone cream (KENALOG) 0.1 % [Pharmacy Med Name: TRIAMCINOLONE 0.1% CREAM] 30 g 1    Sig: APPLY TO AFFECTED AREA TWICE A DAY     Dermatology:  Corticosteroids Passed - 10/12/2020  7:30 PM      Passed - Valid encounter within last 12 months    Recent Outpatient Visits          2 months ago Essential hypertension   [facility] Duanne Limerick, MD   8 months ago Acute idiopathic gout of right knee   [facility] Duanne Limerick, MD   8 months ago Hypothyroidism due to acquired atrophy of thyroid   [facility] Duanne Limerick, MD   1 year ago Chronic kidney disease (CKD) stage G3b/A3, moderately decreased glomerular filtration rate (GFR) between 30-44 mL/min/1.73 square meter and albuminuria creatinine ratio greater than 300 mg/g   [facility] Duanne Limerick, MD   1 year ago Essential hypertension   [facility] Duanne Limerick, MD      Future Appointments            In 3 months Duanne Limerick, MD [facility], [facility]

## 2020-10-25 ENCOUNTER — Ambulatory Visit
Admission: RE | Admit: 2020-10-25 | Discharge: 2020-10-25 | Disposition: A | Discharge status: Home / Self Care | Payer: Medicare Other | Source: Ambulatory Visit | Attending: Family Medicine | Admitting: Family Medicine

## 2020-10-25 ENCOUNTER — Encounter: Payer: Self-pay | Admitting: Family Medicine

## 2020-10-25 ENCOUNTER — Ambulatory Visit
Admission: RE | Admit: 2020-10-25 | Discharge: 2020-10-25 | Disposition: A | Discharge status: Home / Self Care | Payer: Medicare Other | Attending: Family Medicine | Admitting: Family Medicine

## 2020-10-25 ENCOUNTER — Other Ambulatory Visit: Payer: Self-pay

## 2020-10-25 ENCOUNTER — Ambulatory Visit (INDEPENDENT_AMBULATORY_CARE_PROVIDER_SITE_OTHER): Payer: Medicare Other | Admitting: Family Medicine

## 2020-10-25 VITALS — BP 130/98 | HR 68 | Ht 71.0 in | Wt 204.0 lb

## 2020-10-25 DIAGNOSIS — M25531 Pain in right wrist: Secondary | ICD-10-CM | POA: Diagnosis not present

## 2020-10-25 DIAGNOSIS — M659 Synovitis and tenosynovitis, unspecified: Secondary | ICD-10-CM

## 2020-10-25 DIAGNOSIS — G5601 Carpal tunnel syndrome, right upper limb: Secondary | ICD-10-CM

## 2020-10-25 DIAGNOSIS — M65831 Other synovitis and tenosynovitis, right forearm: Secondary | ICD-10-CM | POA: Diagnosis not present

## 2020-10-25 MED ORDER — MELOXICAM 15 MG PO TABS: 15.0000 mg | ORAL_TABLET | Freq: Every day | ORAL | 0 refills | Status: DC

## 2020-10-25 NOTE — Patient Instructions (Signed)
Intersection Syndrome Intersection syndrome is a condition that causes pain on the thumb side of the back of the forearm, about 2-3 inches above the wrist. In this part of the forearm, muscles that help move the thumb cross over muscles that help move the wrist. These muscles may swell when they rub together frequently, which interferes with movement of the bands of tissue that attach muscles to bones (tendons). This causes pain when the wrist is moved because tendons of the wrist and thumb cannot move freely. This condition usually goes away with proper treatment, including changes in activities that cause pain and swelling. What are the causes? This condition is typically caused by repetitive gripping or squeezing. In some cases, this condition may be caused by a hard, direct hit or injury to the back of the forearm. What increases the risk? You are more likely to develop this condition if you do activities that involve a lot of gripping or repetitive motions of the hand or wrist. These activities include: Certain jobs, such as Copywriter, advertising. Certain sports, such as racquet sports, rowing, and weight lifting. What are the signs or symptoms? Symptoms of this condition may include: Pain and tenderness in the forearm and wrist. Pain may get worse when: Gripping objects. Moving the wrist. Bearing weight through the wrist or hand, such as when you push up out of a chair. A feeling or a sound of squeaking or rubbing (crepitation) when moving the thumb or wrist. Swelling of the forearm. How is this diagnosed? This condition may be diagnosed based on: Your medical history. Your symptoms. A physical exam. Your health care provider may ask you to move your wrist and thumb certain ways to check whether you have pain. Imaging tests, such as: MRI. Ultrasound. This uses sound waves to make an image of the affected area. How is this treated? Treatment for this condition may include: Resting  the affected area and modifying activities that cause your symptoms. Icing the injured area. A splint to prevent your wrist and thumb from moving for a period of time (immobilization). NSAIDs to help reduce pain and swelling. An injection of medicine that helps to reduce swelling (steroid). This may be done if other treatments do not reduce your pain. Exercises to help you maintain movement (physical therapy). Surgery to relieve pressure on your tendons and muscles (decompression surgery). This is rare. Usually, surgery is only done if other treatment methods are not helpful. Follow these instructions at home: If you have a splint: Do not put pressure on any part of the splint until it is fully hardened. This may take several hours. Wear the splint as told by your health care provider. Remove it only as told by your health care provider. Loosen the splint if your fingers tingle, become numb, or turn cold and blue. Keep the splint clean. If the splint is not waterproof: Do not let it get wet. Cover it with a watertight covering when you take a bath or a shower. Ask your health care provider when it is safe for you to drive. Managing pain, stiffness, and swelling  If directed, put ice on the injured area. Put ice in a plastic bag. Place a towel between your skin and the bag. Leave the ice on for 20 minutes, 2-3 times a day. Move your fingers often to reduce stiffness and swelling. Raise (elevate) the injured area above the level of your heart while you are sitting or lying down. Activity Avoid activities that cause pain  in your wrist or forearm. Return to your normal activities as told by your health care provider. Ask your health care provider what activities are safe for you. If physical therapy was prescribed, do exercises as told by your health care provider. General instructions Take over-the-counter and prescription medicines only as told by your health care provider. Keep all  follow-up visits as told by your health care provider. This is important. How is this prevented? If you start a new activity that involves repetitive wrist movement, start the activity slowly and gradually build up your strength and flexibility. Stop any activity that causes pain in your thumb, wrist, or forearm. Contact a health care provider if: Your splint becomes loose or damaged. You have pain that does not go away after wearing your splint for as long as told by your health care provider. Summary Intersection syndrome is a condition that causes pain on the thumb side of the back of the forearm. This condition is typically caused by repetitive gripping or squeezing. The condition may be treated with rest, ice, immobilization, and physical therapy. In rare cases, surgery may be needed. Ask your health care provider what activities are safe for you. This information is not intended to replace advice given to you by your health care provider. Make sure you discuss any questions you have with your health care provider. Document Revised: 11/13/2017 Document Reviewed: 11/13/2017 Elsevier Patient Education  2022 [employer].

## 2020-10-25 NOTE — Progress Notes (Signed)
Date:  10/25/2020   Name:  [name]   DOB:  06-29-1945   MRN:  [id number]   Chief Complaint: No chief complaint on file.  Hand Pain  The incident occurred more than 1 week ago. There was no injury mechanism. The pain is present in the right hand and right wrist. The quality of the pain is described as cramping and aching. The pain does not radiate. The pain is at a severity of 4/10 (at it's worse= 8). The pain is moderate. The pain has been Constant since the incident. Associated symptoms include numbness and tingling. Pertinent negatives include no chest pain. The symptoms are aggravated by movement. Treatments tried: indomethacin for gout. The treatment provided no relief.   Lab Results  Component Value Date   CREATININE 1.98 (H) 07/27/2020   BUN 28 (H) 07/27/2020   NA 141 07/27/2020   K 4.7 07/27/2020   CL 103 07/27/2020   CO2 19 (L) 07/27/2020   Lab Results  Component Value Date   CHOL 119 07/27/2020   HDL 33 (L) 07/27/2020   LDLCALC 61 07/27/2020   TRIG 140 07/27/2020   CHOLHDL 4.0 07/03/2017   Lab Results  Component Value Date   TSH 2.770 07/27/2020   No results found for: HGBA1C Lab Results  Component Value Date   WBC 7.6 07/24/2018   HGB 14.0 07/24/2018   HCT 43.7 07/24/2018   MCV 85 07/24/2018   PLT 212 07/24/2018   Lab Results  Component Value Date   ALT 12 07/27/2020   AST 17 07/27/2020   ALKPHOS 74 07/27/2020   BILITOT 0.7 07/27/2020     Review of Systems  Cardiovascular:  Negative for chest pain.  Musculoskeletal:  Negative for arthralgias and myalgias.  Neurological:  Positive for tingling and numbness.   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 bypass   peptic ulcer     removed   TONSILLECTOMY      Social History   Tobacco Use   Smoking status: Never   Smokeless tobacco: Current    Types: Chew  Substance Use Topics   Alcohol use: Yes    Alcohol/week: 0.0 standard drinks   Drug use: No     Medication list has been reviewed and updated.  Current Meds  Medication Sig   allopurinol (ZYLOPRIM) 100 MG tablet Take 1 tablet (100 mg total) by mouth daily.   aspirin 81 MG tablet Take 1 tablet (81 mg total) by mouth daily.   diltiazem (TIAZAC) 120 MG 24 hr capsule Take 1 capsule by mouth daily. kowalski   indomethacin (INDOCIN) 50 MG capsule TAKE 1 CAPSULE BY MOUTH 3 TIMES DAILY WITH MEALS.   levothyroxine (SYNTHROID) 100 MCG tablet Take 1 tablet (100 mcg total) by mouth daily.   lovastatin (MEVACOR) 20 MG tablet Take 1 tablet (20 mg total) by mouth daily.  metoprolol succinate (TOPROL-XL) 50 MG 24 hr tablet Take 1 tablet (50 mg total) by mouth daily. Take with or immediately following a meal.   olmesartan-hydrochlorothiazide (BENICAR HCT) 40-25 MG tablet Take 1 tablet by mouth daily.   triamcinolone cream (KENALOG) 0.1 % APPLY TO AFFECTED AREA TWICE A DAY    PHQ 2/9 Scores 07/27/2020 01/26/2020 08/03/2019 07/24/2018  PHQ - 2 Score 0 0 0 0  PHQ- 9 Score 0 0 0 0    GAD 7 : Generalized Anxiety Score 07/27/2020 01/26/2020 08/03/2019  Nervous, Anxious, on Edge 0 0 0  Control/stop worrying 0 0 0  Worry too much - different things 0 0 0  Trouble relaxing 0 0 0  Restless 0 0 0  Easily annoyed or irritable 0 0 0  Afraid - awful might happen 0 0 0  Total GAD 7 Score 0 0 0    BP Readings from Last 3 Encounters:   07/27/20 138/70  02/15/20 (!) 156/76  01/26/20 130/88    Physical Exam Vitals and nursing note reviewed.  HENT:     Right Ear: Tympanic membrane normal.     Left Ear: Tympanic membrane normal.  Cardiovascular:     Heart sounds: S1 normal and S2 normal. No murmur heard. No systolic murmur is present.  No diastolic murmur is present.    No S3 or S4 sounds.  Pulmonary:     Effort: No respiratory distress.     Breath sounds: No stridor. No wheezing or rhonchi.  Musculoskeletal:     Right wrist: Tenderness present.     Right hand: Tenderness present. No deformity or bony tenderness. Normal range of motion. Decreased strength. Decreased sensation.     Comments: Tender right 1st MP  Skin:    Findings: Bruising present.     Comments: Right thenar eminence    Wt Readings from Last 3 Encounters:  10/25/20 204 lb (92.5 kg)  07/27/20 200 lb (90.7 kg)  02/15/20 209 lb (94.8 kg)    Ht 5\' 11"  (1.803 m)   Wt 204 lb (92.5 kg)   BMI 28.45 kg/m   Assessment and Plan:  1. 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  Controlled.  Patient does notice discomfort in the distribution of the median nerve including a portion of the thumb.  There is also been some subtle weakness patient is noted when making a pinching motion.  We will obtain an x-ray of the wrist given that there is tenderness over the carpals and we will prescribe meloxicam 15 mg once a day and will refer to Dr. for evaluation - DG Wrist Complete Right; Future - meloxicam (MOBIC) 15 MG tablet; Take 1 tablet (15 mg total) by mouth daily.  Dispense: 30 tablet; Refill: 0  2. Tenosynovitis Tenderness and ecchymosis noted over the thenar eminence and palmar area after patient used weedeater for about an hour I suspect that there was some trauma to tendons of the hand with inflammation of the tendon synovial complex.  As noted above we will do x-ray and start Mobic - DG Wrist Complete Right; Future - meloxicam (MOBIC) 15 MG  tablet; Take 1 tablet (15 mg total) by mouth daily.  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 Ashley Royalty although I  think this is more of a carpal tunnel. - meloxicam (MOBIC) 15 MG tablet; Take 1 tablet (15 mg total) by mouth daily.  Dispense: 30 tablet; Refill: 0

## 2020-10-27 ENCOUNTER — Ambulatory Visit (INDEPENDENT_AMBULATORY_CARE_PROVIDER_SITE_OTHER): Payer: Medicare Other | Admitting: Family Medicine

## 2020-10-27 ENCOUNTER — Telehealth: Payer: Self-pay

## 2020-10-27 ENCOUNTER — Other Ambulatory Visit: Payer: Self-pay

## 2020-10-27 ENCOUNTER — Encounter: Payer: Self-pay | Admitting: Family Medicine

## 2020-10-27 VITALS — BP 122/82 | HR 57 | Temp 98.1°F | Ht 71.0 in | Wt 201.0 lb

## 2020-10-27 DIAGNOSIS — G5611 Other lesions of median nerve, right upper limb: Secondary | ICD-10-CM

## 2020-10-27 MED ORDER — GABAPENTIN 100 MG PO CAPS: 100.0000 mg | ORAL_CAPSULE | Freq: Every day | ORAL | 0 refills | Status: DC

## 2020-10-27 NOTE — Telephone Encounter (Signed)
Copied from CRM [id number]. Topic: General - Other >> Oct 27, 2020 10:28 AM [name] A wrote: Reason for CRM: Patient would like to be contacted about concerns related to their gabapentin (NEURONTIN) 100 MG capsule [[id number]] prescription  The patient was told by staff at their preferred pharmacy that they would be unable to pick up the prescription until 11/01/20  Please contact further when possible

## 2020-10-27 NOTE — Patient Instructions (Signed)
-   Wear wrist brace at all times day and night (okay to remove for driving, bathing, handwashing, eating) - Start nightly gabapentin, side effect can be drowsiness - Continue nonstrenuous activity, if you are noticing hand/wrist symptoms with specific activities, pull back from these - Return for follow-up in 3 weeks - Contact us for questions between now and then

## 2020-10-27 NOTE — Assessment & Plan Note (Signed)
Left-hand-dominant patient presenting with right hand/wrist symptoms described as pain, weakness over the past few weeks.  He denies any specific trauma or relative overuse though he is highly active at baseline motorcycle and bike riding, lawn and gardening care, etc.  He feels symptoms in digits 1-3, most recently into his fourth digit at nighttime, pain is worse at night and with increased activity.  He did note some minor bruising at onset throughout the palm.  Physical examination today reveals no thenar atrophy, range of motion is limited with active wrist flexion due to eliciting hand symptoms, strength is preserved, sensation diminished in the median nerve distribution when compared to contralateral, positive Tinel's, negative Phalen's, provocative testing otherwise is negative and he is nontender throughout the hand/wrist.  Given his reassuring x-rays and clinical history, concern for median nerve dysfunction due to specific overuse, given the ecchymosis, he most likely experience some element of tendinopathy that is since resolved that can be suppository for associated median nerve involvement.  Plan for wrist brace throughout the day and night with specific indications for removal, nightly gabapentin 100 mg, activity modification, and close follow-up in 3 weeks.  If suboptimal progress noted, ultrasound-guided injection to be considered.  If improved, home-based rehab to be provided.  Patient is understanding and amenable to this plan moving forward.

## 2020-10-27 NOTE — Progress Notes (Signed)
Primary Care / Sports Medicine Office Visit  Patient Information:  Patient ID: [name], male DOB: Jan 16, 1946 Age: 75 y.o. MRN: [id number]   [name] is a pleasant 75 y.o. male presenting with the following:  Chief Complaint  Patient presents with   New Patient (Initial Visit)   Hand Pain    Right hand and right wrist; x1 month; no known injury; X-ray 10/25/20; taking meloxicam, but too early to tell if it's working since started 2 days ago; cramping, numbness, and swelling associated; pain is worse with fine motor activities; left-handedness; 8/10 pain    Review of Systems pertinent details above   Patient Active Problem List   Diagnosis Date Noted   Median nerve dysfunction, right 10/27/2020   Chronic kidney disease, stage 3 unspecified (HCC) 07/23/2019   Dizziness 12/09/2018   Bradycardia 07/28/2015   Aortic atherosclerosis (HCC) 07/19/2015   Anemia of renal disease 07/19/2015   Heart valve disease 01/18/2015   Endocarditis, valve unspecified 01/18/2015   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   aspirin 81 MG tablet Take 1 tablet (81 mg total) by mouth daily.   diltiazem (TIAZAC) 120 MG 24 hr capsule Take 1 capsule by mouth daily. kowalski   gabapentin (NEURONTIN) 100 MG capsule Take 1 capsule (100 mg total) by mouth at bedtime.   levothyroxine (SYNTHROID) 100 MCG tablet Take 1 tablet (100 mcg  total) by mouth daily.   lovastatin (MEVACOR) 20 MG tablet Take 1 tablet (20 mg total) by mouth daily.   meloxicam (MOBIC) 15 MG tablet Take 1 tablet (15 mg total) by mouth daily.   metoprolol succinate (TOPROL-XL) 50 MG 24 hr tablet Take 1 tablet (50 mg total) by mouth daily. Take with or immediately following a meal.   olmesartan-hydrochlorothiazide (BENICAR HCT) 40-25 MG tablet Take 1 tablet by mouth daily.   sodium bicarbonate 650 MG tablet Take 650 mg by mouth 2 (two) times daily.   triamcinolone cream (KENALOG) 0.1 % APPLY TO AFFECTED AREA TWICE A DAY   [DISCONTINUED] gabapentin (NEURONTIN) 100 MG capsule Take 1 capsule (100 mg total) by mouth at bedtime. One tab PO qHS for a week, then BID for a week, then TID.   indomethacin (INDOCIN) 50 MG capsule TAKE 1 CAPSULE BY MOUTH 3 TIMES DAILY WITH MEALS. (Patient not taking: Reported on 10/27/2020)   No facility-administered encounter medications on file as of 10/27/2020.   Past Surgical History:  Procedure Laterality Date   APPENDECTOMY     CARDIAC SURGERY     quad. bypass   peptic ulcer     removed   TONSILLECTOMY      Vitals:   10/27/20 0916  BP: 122/82  Pulse: (!) 57  Temp: 98.1 F (36.7 C)  SpO2: 96%   Vitals:   10/27/20 0916  Weight: 201 lb (91.2 kg)  Height: 5\' 11"  (1.803 m)   Body mass index  is 28.03 kg/m.  DG Wrist Complete Right  Result Date: 10/26/2020 CLINICAL DATA:  Right wrist pain and bruising for 2 weeks EXAM: RIGHT WRIST - COMPLETE 3+ VIEW COMPARISON:  None. FINDINGS: There is no evidence of fracture or dislocation. There is no evidence of significant arthropathy or other focal bone abnormality. Soft tissues are unremarkable. IMPRESSION: Negative. Electronically Signed   By: Duanne Guess D.O.   On: 10/26/2020 14:21     Independent interpretation of notes and tests performed by another provider:   Independent interpretation of right wrist x-ray dated 10/26/2020 reveals maintained intercarpal space, no  significant degenerative changes, some mild cortical roughening at the distal radius, radial aspect, no acute osseous process identified  Procedures performed:   None  Pertinent History, Exam, Impression, and Recommendations:   Median nerve dysfunction, right Left-hand-dominant patient presenting with right hand/wrist symptoms described as pain, weakness over the past few weeks.  He denies any specific trauma or relative overuse though he is highly active at baseline motorcycle and bike riding, lawn and gardening care, etc.  He feels symptoms in digits 1-3, most recently into his fourth digit at nighttime, pain is worse at night and with increased activity.  He did note some minor bruising at onset throughout the palm.  Physical examination today reveals no thenar atrophy, range of motion is limited with active wrist flexion due to eliciting hand symptoms, strength is preserved, sensation diminished in the median nerve distribution when compared to contralateral, positive Tinel's, negative Phalen's, provocative testing otherwise is negative and he is nontender throughout the hand/wrist.  Given his reassuring x-rays and clinical history, concern for median nerve dysfunction due to specific overuse, given the ecchymosis, he most likely experience some element of tendinopathy that is since resolved that can be suppository for associated median nerve involvement.  Plan for wrist brace throughout the day and night with specific indications for removal, nightly gabapentin 100 mg, activity modification, and close follow-up in 3 weeks.  If suboptimal progress noted, ultrasound-guided injection to be considered.  If improved, home-based rehab to be provided.  Patient is understanding and amenable to this plan moving forward.    Orders & Medications Meds ordered this encounter  Medications   gabapentin (NEURONTIN) 100 MG capsule    Sig: Take 1 capsule (100 mg total) by mouth at bedtime.    Dispense:  21  capsule    Refill:  0   No orders of the defined types were placed in this encounter.    Return in about 3 weeks (around 11/17/2020).     Jerrol Banana, MD   Primary Care Sports Medicine [facility]

## 2020-10-27 NOTE — Telephone Encounter (Signed)
Issue has been resolved.  Patient picked up prescription at 11:13 AM per pharmacist, Florentina Addison.  Nothing further needed.

## 2020-11-06 ENCOUNTER — Other Ambulatory Visit: Payer: Self-pay | Admitting: Family Medicine

## 2020-11-06 DIAGNOSIS — I10 Essential (primary) hypertension: Secondary | ICD-10-CM

## 2020-11-06 DIAGNOSIS — M659 Synovitis and tenosynovitis, unspecified: Secondary | ICD-10-CM

## 2020-11-06 DIAGNOSIS — G5601 Carpal tunnel syndrome, right upper limb: Secondary | ICD-10-CM

## 2020-11-06 DIAGNOSIS — G5611 Other lesions of median nerve, right upper limb: Secondary | ICD-10-CM

## 2020-11-06 DIAGNOSIS — M65831 Other synovitis and tenosynovitis, right forearm: Secondary | ICD-10-CM

## 2020-11-07 NOTE — Telephone Encounter (Signed)
Requested medication (s) are due for refill today No. Last ordered 10/25/20 #30 no refills  Requested medication (s) are on the active medication list Yes  Future visit scheduled  Yes 11/17/20 with Dr.Matthews  Note to clinic-labs do not meet protocol requirements. Routing to office for review.    Requested Prescriptions  Pending Prescriptions Disp Refills   meloxicam (MOBIC) 15 MG tablet [Pharmacy Med Name: MELOXICAM 15 MG TABLET] 30 tablet 0    Sig: TAKE 1 TABLET (15 MG TOTAL) BY MOUTH DAILY.     Analgesics:  COX2 Inhibitors Failed - 11/06/2020 11:26 AM      Failed - HGB in normal range and within 360 days    Hemoglobin  Date Value Ref Range Status  07/24/2018 14.0 13.0 - 17.7 g/dL Final          Failed - Cr in normal range and within 360 days    Creatinine  Date Value Ref Range Status  02/21/2013 2.12 (H) 0.60 - 1.30 mg/dL Final   Creatinine, Ser  Date Value Ref Range Status  07/27/2020 1.98 (H) 0.76 - 1.27 mg/dL Final          Passed - Patient is not pregnant      Passed - Valid encounter within last 12 months    Recent Outpatient Visits           1 week ago Median nerve dysfunction, right   [facility] Jerrol Banana, MD   1 week ago Carpal tunnel syndrome on right   [facility] Duanne Limerick, MD   3 months ago Essential hypertension   [facility] Duanne Limerick, MD   8 months ago Acute idiopathic gout of right knee   [facility] Duanne Limerick, MD   9 months ago Hypothyroidism due to acquired atrophy of thyroid   [facility] Duanne Limerick, MD       Future Appointments             In 1 week Ashley Royalty, Ocie Bob, MD [facility]   In 2 months Duanne Limerick, MD [facility]            Signed Prescriptions Disp Refills   metoprolol succinate (TOPROL-XL) 50 MG 24 hr tablet 90 tablet 0    Sig: TAKE 1 TABLET BY MOUTH DAILY. TAKE WITH OR IMMEDIATELY FOLLOWING A MEAL.      Cardiovascular:  Beta Blockers Passed - 11/06/2020 11:26 AM      Passed - Last BP in normal range    BP Readings from Last 1 Encounters:  10/27/20 122/82          Passed - Last Heart Rate in normal range    Pulse Readings from Last 1 Encounters:  10/27/20 (!) 57          Passed - Valid encounter within last 6 months    Recent Outpatient Visits           1 week ago Median nerve dysfunction, right   [facility] Jerrol Banana, MD   1 week ago Carpal tunnel syndrome on right   [facility] Duanne Limerick, MD   3 months ago Essential hypertension   [facility] Duanne Limerick, MD   8 months ago Acute idiopathic gout of right knee   [facility] Duanne Limerick, MD   9 months ago Hypothyroidism due to acquired atrophy of  thyroid   [facility] Duanne Limerick, MD       Future Appointments             In 1 week Ashley Royalty, Ocie Bob, MD [facility]   In 2 months Duanne Limerick, MD [facility]

## 2020-11-07 NOTE — Telephone Encounter (Signed)
Toprol xl approved per protocol. Meloxicam will be routed to office for review.

## 2020-11-07 NOTE — Telephone Encounter (Signed)
Rx request 

## 2020-11-07 NOTE — Telephone Encounter (Signed)
Future OV 11/17/20 follow-up. Provided 30 day supply until the appointment with Dr. Ashley Royalty.

## 2020-11-17 ENCOUNTER — Encounter: Payer: Self-pay | Admitting: Family Medicine

## 2020-11-17 ENCOUNTER — Ambulatory Visit (INDEPENDENT_AMBULATORY_CARE_PROVIDER_SITE_OTHER): Payer: Medicare Other | Admitting: Family Medicine

## 2020-11-17 ENCOUNTER — Other Ambulatory Visit: Payer: Self-pay

## 2020-11-17 ENCOUNTER — Inpatient Hospital Stay (INDEPENDENT_AMBULATORY_CARE_PROVIDER_SITE_OTHER): Payer: Medicare Other | Admitting: Radiology

## 2020-11-17 VITALS — BP 128/78 | HR 53 | Ht 71.0 in | Wt 200.0 lb

## 2020-11-17 DIAGNOSIS — G5611 Other lesions of median nerve, right upper limb: Secondary | ICD-10-CM

## 2020-11-17 DIAGNOSIS — M659 Synovitis and tenosynovitis, unspecified: Secondary | ICD-10-CM | POA: Diagnosis not present

## 2020-11-17 DIAGNOSIS — M65831 Other synovitis and tenosynovitis, right forearm: Secondary | ICD-10-CM | POA: Diagnosis not present

## 2020-11-17 DIAGNOSIS — G5601 Carpal tunnel syndrome, right upper limb: Secondary | ICD-10-CM | POA: Diagnosis not present

## 2020-11-17 MED ORDER — TRIAMCINOLONE ACETONIDE 40 MG/ML IJ SUSP
40.0000 mg | Freq: Once | INTRAMUSCULAR | Status: AC
  Administered 2020-11-17: 40 mg

## 2020-11-17 MED ORDER — GABAPENTIN 100 MG PO CAPS: ORAL_CAPSULE | ORAL | 0 refills | Status: DC

## 2020-11-17 MED ORDER — MELOXICAM 15 MG PO TABS: 15.0000 mg | ORAL_TABLET | Freq: Every day | ORAL | 0 refills | Status: DC | PRN

## 2020-11-17 NOTE — Patient Instructions (Signed)
You have just been given a cortisone injection to reduce pain and inflammation. After the injection you may notice immediate relief of pain as a result of the Lidocaine. It is important to rest the area of the injection for 24 to 48 hours after the injection. There is a possibility of some temporary increased discomfort and swelling for up to 72 hours until the cortisone begins to work. If you do have pain, simply rest the joint and use ice. If you can tolerate over the counter medications, you can try Tylenol, Aleve, or Advil for added relief per package instructions. - Wear wrist brace throughout the day and ideally night x2 weeks - Limit activity with the right hand to simple daily tasks - Increase gabapentin to 2 capsules nightly, continue 1 capsule in the a.m. - Meloxicam can be dosed once daily on an as-needed basis for hand/wrist pain - Return for follow-up in 2 weeks - Contact her office for any questions between now and then

## 2020-11-17 NOTE — Addendum Note (Signed)
Addended by: Lennart Pall on: 11/17/2020 09:57 AM   Modules accepted: Orders

## 2020-11-17 NOTE — Progress Notes (Signed)
Primary Care / Sports Medicine Office Visit  Patient Information:  Patient ID: [name], male DOB: 19-Jun-1945 Age: 75 y.o. MRN: [id number]   [name] is a pleasant 75 y.o. male presenting with the following:  Chief Complaint  Patient presents with   Median nerve dysfunction, right    Taking gabapentin and meloxicam with no relief; 5-8/10 pain    Review of Systems pertinent details above   Patient Active Problem List   Diagnosis Date Noted   Median nerve dysfunction, right 10/27/2020   Chronic kidney disease, stage 3 unspecified (HCC) 07/23/2019   Dizziness 12/09/2018   Bradycardia 07/28/2015   Aortic atherosclerosis (HCC) 07/19/2015   Anemia of renal disease 07/19/2015   Heart valve disease 01/18/2015   Endocarditis, valve unspecified 01/18/2015   Familial multiple lipoprotein-type hyperlipidemia 07/05/2014   Pericarditis associated with severe chronic anemia 07/05/2014   Coronary atherosclerosis 07/05/2014   A-fib (HCC) 07/05/2014   Gout 07/05/2014   BP (high blood pressure) 07/05/2014   Adult hypothyroidism 07/05/2014   Arthritis of knee, degenerative 07/05/2014   Benign essential HTN 05/20/2014   Past Medical History:  Diagnosis Date   Gout    Hyperlipidemia    Hypertension    Thyroid disease    Outpatient Encounter Medications as of 11/17/2020  Medication Sig   allopurinol (ZYLOPRIM) 100 MG tablet Take 1 tablet (100 mg total) by mouth daily.   aspirin 81 MG tablet Take 1 tablet (81 mg total) by mouth daily.   diltiazem (TIAZAC) 120 MG 24 hr capsule Take 1 capsule by mouth daily. kowalski   levothyroxine (SYNTHROID) 100 MCG tablet Take 1 tablet (100 mcg total) by mouth daily.   lovastatin (MEVACOR) 20 MG tablet Take 1 tablet (20 mg total) by mouth daily.   metoprolol succinate (TOPROL-XL) 50 MG 24 hr tablet TAKE 1 TABLET BY MOUTH DAILY. TAKE WITH OR IMMEDIATELY FOLLOWING A MEAL.   olmesartan-hydrochlorothiazide (BENICAR HCT) 40-25 MG tablet Take 1  tablet by mouth daily.   sodium bicarbonate 650 MG tablet Take 650 mg by mouth 2 (two) times daily.   triamcinolone cream (KENALOG) 0.1 % APPLY TO AFFECTED AREA TWICE A DAY   [DISCONTINUED] gabapentin (NEURONTIN) 100 MG capsule TAKE 1 CAP BY MOUTH AT BEDTIME FOR 1 WEEK, THEN TWICE DAILY FOR 1 WEEK, THEN THREE TIMES DAILY   [DISCONTINUED] meloxicam (MOBIC) 15 MG tablet TAKE 1 TABLET (15 MG TOTAL) BY MOUTH DAILY.   gabapentin (NEURONTIN) 100 MG capsule Take 1 capsule every AM and 2 capsules every PM   indomethacin (INDOCIN) 50 MG capsule TAKE 1 CAPSULE BY MOUTH 3 TIMES DAILY WITH MEALS. (Patient not taking: No sig reported)   meloxicam (MOBIC) 15 MG tablet Take 1 tablet (15 mg total) by mouth daily as needed for pain.   No facility-administered encounter medications on file as of 11/17/2020.   Past Surgical History:  Procedure Laterality Date   APPENDECTOMY     CARDIAC SURGERY     quad. bypass   peptic ulcer     removed   TONSILLECTOMY      Vitals:   11/17/20 0805  BP: 128/78  Pulse: (!) 53  SpO2: 98%   Vitals:   11/17/20 0805  Weight: 200 lb (90.7 kg)  Height: 5\' 11"  (1.803 m)   Body mass index is 27.89 kg/m.  DG Wrist Complete Right  Result Date: 10/26/2020 CLINICAL DATA:  Right wrist pain and bruising for 2 weeks EXAM: RIGHT WRIST - COMPLETE 3+  VIEW COMPARISON:  None. FINDINGS: There is no evidence of fracture or dislocation. There is no evidence of significant arthropathy or other focal bone abnormality. Soft tissues are unremarkable. IMPRESSION: Negative. Electronically Signed   By: Duanne Guess D.O.   On: 10/26/2020 14:21     Independent interpretation of notes and tests performed by another provider:   None  Procedures performed:   Procedure:  Injection of right carpal tunnel with Hydro dissection about the median nerve under ultrasound guidance. Ultrasound guidance utilized for in plane approach to median nerve, perineural needle placement, hypoechoic response  from injectate Samsung HS60 device utilized with permanent recording / reporting. Consent obtained and verified. Skin prepped in a sterile fashion. Ethyl chloride spray for topical local analgesia.  Completed without difficulty and tolerated well. Medication: triamcinolone acetonide 40 mg/mL suspension for injection 1 mL total, 5 mL D5W, and 1 mL lidocaine 1% without epinephrine utilized for needle placement anesthetic Advised to contact for fevers/chills, erythema, induration, drainage, or persistent bleeding.   Pertinent History, Exam, Impression, and Recommendations:   Median nerve dysfunction, right Despite medications, sporadic brace usage citing discomfort, and relative rest, patient states that symptoms have improved.  His clinical features again show focality to the median nerve and primarily sensory with minimal motor dysfunction in his distribution, radial and ulnar sensorimotor function preserved.  I did review additional treatment strategies and he did elect to proceed with ultrasound-guided carpal tunnel injection, he tolerated this well, post care reviewed.  He will maintain close follow-up in 2 weeks for reevaluation.  If suboptimal progress noted, nerve conduction studies to be discussed in addition to surgical options.  We can also review escalation of pharmacotherapeutic options.  If improved, wean from brace to nighttime use alone, home-based rehab, and gradual increase in activity.    Orders & Medications Meds ordered this encounter  Medications   gabapentin (NEURONTIN) 100 MG capsule    Sig: Take 1 capsule every AM and 2 capsules every PM    Dispense:  42 capsule    Refill:  0   meloxicam (MOBIC) 15 MG tablet    Sig: Take 1 tablet (15 mg total) by mouth daily as needed for pain.    Dispense:  14 tablet    Refill:  0   Orders Placed This Encounter  Procedures   [place] LIMITED JOINT SPACE STRUCTURES UP RIGHT     Return in about 2 weeks (around 12/01/2020).      Jerrol Banana, MD   Primary Care Sports Medicine [facility]

## 2020-11-17 NOTE — Assessment & Plan Note (Signed)
Despite medications, sporadic brace usage citing discomfort, and relative rest, patient states that symptoms have improved.  His clinical features again show focality to the median nerve and primarily sensory with minimal motor dysfunction in his distribution, radial and ulnar sensorimotor function preserved.  I did review additional treatment strategies and he did elect to proceed with ultrasound-guided carpal tunnel injection, he tolerated this well, post care reviewed.  He will maintain close follow-up in 2 weeks for reevaluation.  If suboptimal progress noted, nerve conduction studies to be discussed in addition to surgical options.  We can also review escalation of pharmacotherapeutic options.  If improved, wean from brace to nighttime use alone, home-based rehab, and gradual increase in activity.

## 2020-12-01 ENCOUNTER — Other Ambulatory Visit: Payer: Self-pay

## 2020-12-01 ENCOUNTER — Encounter: Payer: Self-pay | Admitting: Family Medicine

## 2020-12-01 ENCOUNTER — Ambulatory Visit (INDEPENDENT_AMBULATORY_CARE_PROVIDER_SITE_OTHER): Payer: Medicare Other | Admitting: Family Medicine

## 2020-12-01 VITALS — BP 122/64 | HR 74 | Ht 71.0 in | Wt 198.0 lb

## 2020-12-01 DIAGNOSIS — G5611 Other lesions of median nerve, right upper limb: Secondary | ICD-10-CM | POA: Diagnosis not present

## 2020-12-01 DIAGNOSIS — G5601 Carpal tunnel syndrome, right upper limb: Secondary | ICD-10-CM | POA: Diagnosis not present

## 2020-12-01 MED ORDER — GABAPENTIN 100 MG PO CAPS: ORAL_CAPSULE | ORAL | 0 refills | Status: DC

## 2020-12-01 NOTE — Patient Instructions (Signed)
-   Start physical therapy with the referral provided (referral coordinator will contact you for scheduling) - Continue a.m. gabapentin 100 mg (1 capsule) - Increase p.m. gabapentin to 300 mg (3 capsules) - Can reserve meloxicam for once daily as needed dosing for pain and/or swelling - Remove wristwatch at night and use wrist splint throughout the night - Return for follow-up in 6 weeks, contact us for questions between now and then

## 2020-12-01 NOTE — Progress Notes (Signed)
Primary Care / Sports Medicine Office Visit  Patient Information:  Patient ID: [name], male DOB: [birth year] Age: 75 y.o. MRN: [id number]   [name] is a pleasant 75 y.o. male presenting with the following:  Chief Complaint  Patient presents with   Median nerve dysfunction, right    X-Ray 10/25/20; taking meloxicam and had cortisone injection 11/17/20 with some relief; 3/10 pain    Review of Systems pertinent details above   Patient Active Problem List   Diagnosis Date Noted   Carpal tunnel syndrome on right 12/01/2020   Median nerve dysfunction, right 10/27/2020   Chronic kidney disease, stage 3 unspecified (HCC) 07/23/2019   Dizziness 12/09/2018   Bradycardia 07/28/2015   Aortic atherosclerosis (HCC) 07/19/2015   Anemia of renal disease 07/19/2015   Heart valve disease 01/18/2015   Endocarditis, valve unspecified 01/18/2015   Familial multiple lipoprotein-type hyperlipidemia 07/05/2014   Pericarditis associated with severe chronic anemia 07/05/2014   Coronary atherosclerosis 07/05/2014   A-fib (HCC) 07/05/2014   Gout 07/05/2014   BP (high blood pressure) 07/05/2014   Adult hypothyroidism 07/05/2014   Arthritis of knee, degenerative 07/05/2014   Benign essential HTN 05/20/2014   Past Medical History:  Diagnosis Date   Gout    Hyperlipidemia    Hypertension    Thyroid disease    Outpatient Encounter Medications as of 12/01/2020  Medication Sig   allopurinol (ZYLOPRIM) 100 MG tablet Take 1 tablet (100 mg total) by mouth daily.   aspirin 81 MG tablet Take 1 tablet (81 mg total) by mouth daily.   diltiazem (TIAZAC) 120 MG 24 hr capsule Take 1 capsule by mouth daily. kowalski   indomethacin (INDOCIN) 50 MG capsule TAKE 1 CAPSULE BY MOUTH 3 TIMES DAILY WITH MEALS.   levothyroxine (SYNTHROID) 100 MCG tablet Take 1 tablet (100 mcg total) by mouth daily.   lovastatin (MEVACOR) 20 MG tablet Take 1 tablet (20 mg total) by mouth daily.   meloxicam (MOBIC) 15 MG  tablet Take 1 tablet (15 mg total) by mouth daily as needed for pain.   metoprolol succinate (TOPROL-XL) 50 MG 24 hr tablet TAKE 1 TABLET BY MOUTH DAILY. TAKE WITH OR IMMEDIATELY FOLLOWING A MEAL.   olmesartan-hydrochlorothiazide (BENICAR HCT) 40-25 MG tablet Take 1 tablet by mouth daily.   sodium bicarbonate 650 MG tablet Take 650 mg by mouth 2 (two) times daily.   triamcinolone cream (KENALOG) 0.1 % APPLY TO AFFECTED AREA TWICE A DAY   [DISCONTINUED] gabapentin (NEURONTIN) 100 MG capsule Take 1 capsule every AM and 2 capsules every PM   gabapentin (NEURONTIN) 100 MG capsule Take 1 capsule every AM and 3 capsules every PM   No facility-administered encounter medications on file as of 12/01/2020.   Past Surgical History:  Procedure Laterality Date   APPENDECTOMY     CARDIAC SURGERY     quad. bypass   peptic ulcer     removed   TONSILLECTOMY      Vitals:   12/01/20 1056  BP: 122/64  Pulse: 74  SpO2: 97%   Vitals:   12/01/20 1056  Weight: 198 lb (89.8 kg)  Height: 5\' 11"  (1.803 m)   Body mass index is 27.62 kg/m.     Independent interpretation of notes and tests performed by another provider:   None  Procedures performed:   None  Pertinent History, Exam, Impression, and Recommendations:   Carpal tunnel syndrome on right Patient has demonstrated interval improvement following median nerve Hydro dissection,  has been able to tolerate daily tasks without neuropathic pain, still has sensory deficits in the median nerve distribution based on examination today.  Radial and ulnar nerve distribution of the hand is intact.  Given his interval progress, we will continue a nonsurgical manner and I have advised patient on next appropriate steps.  He is amenable to the start of formal physical therapy, titration of gabapentin to 300 mg at night, he will continue on 100 mg in the morning, he has not been using the night splint, we have discussed this today he is amenable to utilizing  this (while also removing his wrist watch at night), and he will dose meloxicam on an as-needed basis-she was dosing this daily that this was advised to be as needed at the last visit.  Plan for follow-up in 6 weeks time for reevaluation and assessment of how appropriately would be to continue a nonsurgical manner.  If interval progress noted, can continue with the same, if suboptimal progress noted nerve conduction studies and surgical evaluation to be considered, would also consider repeat injections if appropriate.   Orders & Medications Meds ordered this encounter  Medications   gabapentin (NEURONTIN) 100 MG capsule    Sig: Take 1 capsule every AM and 3 capsules every PM    Dispense:  42 capsule    Refill:  0   Orders Placed This Encounter  Procedures   Ambulatory referral to Physical Therapy     Return in about 6 weeks (around 01/12/2021).     Jerrol Banana, MD   Primary Care Sports Medicine [facility]

## 2020-12-01 NOTE — Assessment & Plan Note (Signed)
See additional assessment(s) for plan details. 

## 2020-12-01 NOTE — Assessment & Plan Note (Signed)
Patient has demonstrated interval improvement following median nerve Hydro dissection, has been able to tolerate daily tasks without neuropathic pain, still has sensory deficits in the median nerve distribution based on examination today.  Radial and ulnar nerve distribution of the hand is intact.  Given his interval progress, we will continue a nonsurgical manner and I have advised patient on next appropriate steps.  He is amenable to the start of formal physical therapy, titration of gabapentin to 300 mg at night, he will continue on 100 mg in the morning, he has not been using the night splint, we have discussed this today he is amenable to utilizing this (while also removing his wrist watch at night), and he will dose meloxicam on an as-needed basis-she was dosing this daily that this was advised to be as needed at the last visit.  Plan for follow-up in 6 weeks time for reevaluation and assessment of how appropriately would be to continue a nonsurgical manner.  If interval progress noted, can continue with the same, if suboptimal progress noted nerve conduction studies and surgical evaluation to be considered, would also consider repeat injections if appropriate.

## 2020-12-06 ENCOUNTER — Ambulatory Visit: Payer: Medicare Other | Attending: Family Medicine | Admitting: Physical Therapy

## 2020-12-06 ENCOUNTER — Encounter: Payer: Self-pay | Admitting: Physical Therapy

## 2020-12-06 ENCOUNTER — Other Ambulatory Visit: Payer: Self-pay

## 2020-12-06 DIAGNOSIS — R29898 Other symptoms and signs involving the musculoskeletal system: Secondary | ICD-10-CM

## 2020-12-06 DIAGNOSIS — G5611 Other lesions of median nerve, right upper limb: Secondary | ICD-10-CM | POA: Diagnosis not present

## 2020-12-06 DIAGNOSIS — M79641 Pain in right hand: Secondary | ICD-10-CM

## 2020-12-06 DIAGNOSIS — G5601 Carpal tunnel syndrome, right upper limb: Secondary | ICD-10-CM

## 2020-12-06 NOTE — Patient Instructions (Signed)
Access Code: [email or web address] URL: [email or web address] Date: 12/06/2020 Prepared by: Dorene Grebe  Exercises  Towel Roll Squeeze - 1 x daily - 5 x weekly - 3 sets - 10 reps Standing Median Nerve Glide - 1 x daily - 5 x weekly - 3 sets - 10 reps Putty Squeezes - 1 x daily - 5 x weekly - 3 sets - 10 reps Finger Pinch and Pull with Putty - 1 x daily - 5 x weekly - 3 sets - 10 reps Seated Wrist Extension with Overpressure - 1 x daily - 5 x weekly - 3 sets - 30 hold

## 2020-12-06 NOTE — Therapy (Signed)
[facility] [address] [place], [place], [address] Phone: [phone]   Fax:  [phone]  Physical Therapy Treatment and Initial Evaluation 12/06/2020  Patient Details  Name: [name] MRN: [id number] Date of Birth: 1946-01-26 (75 years old) Referring Provider (PT): Dr. Joseph Berkshire MD  Encounter Date: 12/06/2020   PT End of Session - 12/06/20 1425     Visit Number 1    Number of Visits 6    Date for PT Re-Evaluation 01/17/21    Authorization - Visit Number 1    Authorization - Number of Visits 10    Progress Note Due on Visit 10    PT Start Time 0855    PT Stop Time 0946    PT Time Calculation (min) 51 min    Equipment Utilized During Treatment Other (comment)   Hand Putty   Activity Tolerance Patient tolerated treatment well;Patient limited by fatigue;No increased pain    Behavior During Therapy WFL for tasks assessed/performed             Past Medical History:  Diagnosis Date   Gout    Hyperlipidemia    Hypertension    Thyroid disease     Past Surgical History:  Procedure Laterality Date   APPENDECTOMY     CARDIAC SURGERY     quad. bypass   peptic ulcer     removed   TONSILLECTOMY      There were no vitals filed for this visit.   Subjective Assessment - 12/06/20 1110     Subjective Pt presents to tx with a dx of R carpal tunnel syndrome. Pt states insidious onset with sx consistent with burning, numbness, and tingling. Pt states that he also noticed bruising in the hand with onset of R hand weakness and pain. Pt is currently retired and does not undergo repetitive movements. Pt states onset of sx over the last 1.5 month with gradual improvement. Pt is currently wearing a R hand/wrist splint. Pt states that he has responded well to cortisone shot.    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 Pt is currently taking gabapentin for nerve related pain. PMH: A-fib, quad bypass, brady  cardia, to name a few (See PMH for extensive list)    Limitations Lifting;House hold activities    How long can you sit comfortably? WNL    How long can you stand comfortably? WNL    How long can you walk comfortably? WNL    Diagnostic tests x-ray 10/25/2020: and [place]: 11/17/2020. FINDINGS:  There is no evidence of fracture or dislocation. There is no.evidence of significant arthropathy or other focal bone abnormality.    Patient Stated Goals Increase R hand function.    Currently in Pain? Yes    Pain Score 3     Pain Location Hand    Pain Orientation Right;Medial;Proximal    Pain Descriptors / Indicators Burning;Shooting;Numbness;Tingling    Pain Type Acute pain    Pain Radiating Towards occ ant forearm    Pain Onset More than a month ago    Pain Frequency Constant    Aggravating Factors  light touch to median nerve distribution.    Pain Relieving Factors Remove the light touch stimulation and rest.    Effect of Pain on Daily Activities Any ADL that includes R grip strength               SUBJECTIVE Chief complaint:  Pt presents to tx with a dx  of R carpal tunnel syndrome. Pt states insidious onset with sx consistent with burning, numbness, and tingling. Pt states that he also noticed bruising in the hand with onset of R hand weakness and pain. Pt is currently retired and does not undergo repeative movements. Pt states onset of sx over the last 1.5 month with gradual improvement. Pt is currently wearing a R hand splint. Pt states that he has responded well to cortisone shot. 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 Pt is currently taking gabapentin for nerve related pain. PMH: A-fib, quad bypass, brady cardia, to name a few (See PMH for extensive list).  Referring Dx: Carpal Tunnel Syndrome on Right and Median nerve dysfunction, Right Referring Provider: Dr. Joseph Berkshire MD  Pain location: Median nerve distribution of the R hand Pain: Present 3/10, Best 0/10,  Worst 3-4/10: Pain quality: Burning, N/t, stiffness  Radiating pain: minor ant forearm.  Numbness/Tingling: Yes in the median nerve pattern  24 hour pain behavior: During the evening and sleeping.  Aggravating factors: light touch to median nerve distribution.  Easing factors: Remove the light touch stimulation and rest.  History of UE  injury, pain, surgery, or therapy:  Follow-up appointment with MD: bilat bicep tear Dominant hand: PT states that he uses both UE equally. Pt writes with LUE Imaging: x-ray 10/25/2020: and [place]: 11/17/2020. FINDINGS: There is no evidence of fracture or dislocation. There is no.evidence of significant arthropathy or other focal bone abnormality. Soft tissues are unremarkable.  Falls in the last 6 months: No  Occupational demands: Retired Presenter, broadcasting: Statistician, riding motorcycle, working with hands Goals: Increase R hand function.  Red flags (bowel/bladder changes, saddle paresthesia, personal history of cancer, chills/fever, night sweats, unrelenting pain, first onset of insidious LBP <20 y/o) Negative    OBJECTIVE   FOTO: 48 with predicted value of 53  Mental Status Patient is oriented to person, place and time.  Recent memory is intact.  Remote memory is intact.  Attention span and concentration are intact.  Expressive speech is intact.  Patient's fund of knowledge is within normal limits for educational level.  SENSATION: Grossly intact to light touch bilateral UEs as determined by testing dermatomes C2-T2 (reduced sensation at R C6)     MUSCULOSKELETAL: Tremor: None Bulk: Bilat bicep bulk at distal humerus (previous injury) Tone: Slight reduction in R Thenar tone compared to L   Posture Upper cross with forward head posture. Able to correct with verbal cueing. However, return to poor posture within a short amount of time.   Gait No gross abnormality noted during gait into and out of clinic.    Palpation -Palpation WNL       Strength R/L 4-/4- Shoulder flexion  4-/4- Shoulder abduction 4+/4+ Shoulder external rotation  4+/4+  Shoulder internal rotation  5/5 Elbow flexion  4+/4+  Elbow extension 5/5 Wrist Extension 5/5 Wrist Flexion 5/5 Finger adduction (interossei, ulnar n, T1) Grip strength: pre nerve slide R/L: 17.8/88.3. post nerve slide R/L 25.4/88.3  AROM R/L 180/180 Shoulder flexion 180/180 Shoulder abduction 90/90 Shoulder external rotation 70/70 Shoulder internal rotation 60/60  Shoulder extension 80/80 Wrist flexion  70/70 Wrist extension  45/90 Finger flexion (90/90 PROM)         SPECIAL TESTS  Tinel's, carpel tunnel compression, and Phalen's test: neg bilat. Pt states special tests where pain provoking during MD examination.   Pt displayed decrease pain and increased grip strength with median nerve glides.    HEP   Access Code:  [email or web address] URL: [email or web address] Date: 12/06/2020 Prepared by: Dorene Grebe  Exercises   Towel Roll Squeeze - 1 x daily - 5 x weekly - 3 sets - 10 reps Standing Median Nerve Glide - 1 x daily - 5 x weekly - 3 sets - 10 reps Putty Squeezes - 1 x daily - 5 x weekly - 3 sets - 10 reps Finger Pinch and Pull with Putty - 1 x daily - 5 x weekly - 3 sets - 10 reps Seated Wrist Extension with Overpressure - 1 x daily - 5 x weekly - 3 sets - 30 hold      PT Education - 12/06/20 1256     Education Details Pt was educated on prognosis, POC, and HEP.    Person(s) Educated Patient    Methods Explanation;Demonstration;Handout;Verbal cues;Tactile cues    Comprehension Verbalized understanding;Returned demonstration                 PT Long Term Goals - 12/06/20 1555       PT LONG TERM GOAL #1   Title Pt will improve FOTO score to predicted value of 68 to measure self reported improvement in ADL function.    Baseline 48    Time 6    Period Weeks    Status New    Target Date 01/17/21      PT LONG TERM GOAL #2    Title Pt will improve R grip strength to 50# in the RUE in order to allow greater access to ADLs that involve functional carrying.    Baseline Grip strength: Grip strength: pre nerve slide R/L: 17.8/88.3. post nerve slide R/L 25.4/88.3.    Time 6    Period Weeks    Status New    Target Date 01/17/21      PT LONG TERM GOAL #3   Title Pt will increase strength by at least 1/2 MMT grade in order to demonstrate improvement in strength and function    Baseline UE MMT: R/L  4-/4- Shoulder flexion. 4-/4- Shoulder abduction. 4+/4+ Shoulder external rotation. 4+/4+  Shoulder internal rotation. 5/5 Elbow flexion. 4+/4+  Elbow extension  5/5 Wrist Extension. 5/5 Wrist Flexion. 5/5 Finger adduction.    Time 6    Period Weeks    Status New    Target Date 01/17/21      PT LONG TERM GOAL #4   Title Pt will improve R hand AROM equal to L to allow proper grasp of house hold objects.    Baseline AROM R/L: 45/90 Finger flexion (90/90 PROM).    Time 6    Period Weeks    Status New    Target Date 01/17/21                   Plan - 12/06/20 1551     Clinical Impression Statement Pt is a pleasant 75 year-old male referred for: Right Carpal Tunnel Syndrome on and Median nerve dysfunction. PT examination reveals the following deficits: FOTO: 48 with predicted value of 68. AROM R/L: 45/90 Finger flexion (90/90 PROM). Grip strength: pre-nerve glide R/L: 17.8/88.3. post nerve glide R/L 25.4/88.3. UE MMT: R/L  4-/4- Shoulder flexion. 4-/4- Shoulder abduction. 4+/4+ Shoulder external rotation. 4+/4+  Shoulder internal rotation. 5/5 Elbow flexion. 4+/4+  Elbow extension  5/5 Wrist Extension. 5/5 Wrist Flexion. 5/5 Finger adduction. Pt case displays as moderate complexity with good prognosis for optimal return to PLOF due to time from onset, current level of daily physically activity, PMH, and  PLOF.  Pt. will benefit from skilled PT 1x/week for 6 weeks to address deficits and promote safe independence with  community and home related mobility and ADLs.    Personal Factors and Comorbidities Age;Comorbidity 3+;Behavior Pattern;Fitness    Comorbidities see PMH    Examination-Activity Limitations Bathing;Carry;Hygiene/Grooming;Dressing    Examination-Participation Restrictions Interpersonal Relationship;Yard Work;Laundry;Cleaning;Driving    Stability/Clinical Decision Making Evolving/Moderate complexity    Clinical Decision Making Moderate    Rehab Potential Fair    PT Frequency 1x / week    PT Duration 6 weeks    PT Treatment/Interventions ADLs/Self Care Home Management;Biofeedback;Electrical Stimulation;Moist Heat;Traction;Ultrasound;Fluidtherapy;Therapeutic activities;Functional mobility training;Therapeutic exercise;Balance training;Neuromuscular re-education;Manual techniques;Passive range of motion;Dry needling;Spinal Manipulations;Joint Manipulations    PT Next Visit Plan reassess HEP adherence.    PT Home Exercise Plan [email or web address]    Recommended Other Services Not at this time.    Consulted and Agree with Plan of Care Patient             Patient will benefit from skilled therapeutic intervention in order to improve the following deficits and impairments:  Decreased activity tolerance, Decreased coordination, Decreased endurance, Decreased mobility, Decreased range of motion, Decreased safety awareness, Decreased strength, Hypomobility, Impaired perceived functional ability, Impaired tone, Impaired flexibility, Impaired sensation, Impaired UE functional use, Improper body mechanics, Pain  Visit Diagnosis: Carpal tunnel syndrome of right wrist  Decreased grip strength of right hand  Pain in right hand  Weakness of right hand     Problem List Patient Active Problem List   Diagnosis Date Noted   Carpal tunnel syndrome on right 12/01/2020   Median nerve dysfunction, right 10/27/2020   Chronic kidney disease, stage 3 unspecified (HCC) 07/23/2019   Dizziness 12/09/2018   Bradycardia  07/28/2015   Aortic atherosclerosis (HCC) 07/19/2015   Anemia of renal disease 07/19/2015   Heart valve disease 01/18/2015   Endocarditis, valve unspecified 01/18/2015   Familial multiple lipoprotein-type hyperlipidemia 07/05/2014   Pericarditis associated with severe chronic anemia 07/05/2014   Coronary atherosclerosis 07/05/2014   A-fib (HCC) 07/05/2014   Gout 07/05/2014   BP (high blood pressure) 07/05/2014   Adult hypothyroidism 07/05/2014   Arthritis of knee, degenerative 07/05/2014   Benign essential HTN 05/20/2014   Cammie Mcgee, PT, DPT # [id number] Lawernce Ion, SPT 12/07/2020, 8:53 AM  [facility] [address] [place], [place], [address] Phone: [phone]   Fax:  [phone]  Name: ANH MANGANO MRN: [id number] Date of Birth: [birth year]

## 2020-12-13 ENCOUNTER — Other Ambulatory Visit: Payer: Self-pay

## 2020-12-13 ENCOUNTER — Other Ambulatory Visit: Payer: Self-pay | Admitting: Family Medicine

## 2020-12-13 ENCOUNTER — Ambulatory Visit: Payer: Medicare Other | Admitting: Physical Therapy

## 2020-12-13 ENCOUNTER — Encounter: Payer: Self-pay | Admitting: Physical Therapy

## 2020-12-13 DIAGNOSIS — R29898 Other symptoms and signs involving the musculoskeletal system: Secondary | ICD-10-CM | POA: Diagnosis not present

## 2020-12-13 DIAGNOSIS — G5611 Other lesions of median nerve, right upper limb: Secondary | ICD-10-CM

## 2020-12-13 DIAGNOSIS — G5601 Carpal tunnel syndrome, right upper limb: Secondary | ICD-10-CM | POA: Diagnosis not present

## 2020-12-13 DIAGNOSIS — M79641 Pain in right hand: Secondary | ICD-10-CM | POA: Diagnosis not present

## 2020-12-13 NOTE — Therapy (Signed)
[facility] [address] [place], [place], [address] Phone: [phone]   Fax:  [phone]  Physical Therapy Treatment  Patient Details  Name: [name] MRN: [id number] Date of Birth: 09-06-1945 Referring Provider (PT): Dr. Joseph Berkshire MD   Encounter Date: 12/13/2020   PT End of Session - 12/13/20 1157     Visit Number 2    Number of Visits 6    Date for PT Re-Evaluation 01/17/21    Authorization - Visit Number 2    Authorization - Number of Visits 10    Progress Note Due on Visit 10    PT Start Time 1031    PT Stop Time 1116    PT Time Calculation (min) 45 min    Equipment Utilized During Treatment Other (comment)   Hand Putty   Activity Tolerance Patient tolerated treatment well;Patient limited by fatigue;No increased pain    Behavior During Therapy WFL for tasks assessed/performed             Past Medical History:  Diagnosis Date   Gout    Hyperlipidemia    Hypertension    Thyroid disease     Past Surgical History:  Procedure Laterality Date   APPENDECTOMY     CARDIAC SURGERY     quad. bypass   peptic ulcer     removed   TONSILLECTOMY      There were no vitals filed for this visit.   Subjective Assessment - 12/13/20 1033     Subjective Pt presents to tx without complaints of pain. Pt reports increased use with R hand during all ADLs. Pt continues to state poor ability to button his shirt 2/2 poor 1 and 2 finger pitch grip. Pt states good adherence to HEP with marked increased in R hand soreness after the 1st day of adherence that reduced to baseline within 24 hours.    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 Pt is currently taking gabapentin for nerve related pain. PMH: A-fib, quad bypass, brady cardia, to name a few (See PMH for extensive list)    Limitations Lifting;House hold activities    How long can you sit comfortably? WNL    How long can you stand  comfortably? WNL    How long can you walk comfortably? WNL    Diagnostic tests x-ray 10/25/2020: and [place]: 11/17/2020. FINDINGS:  There is no evidence of fracture or dislocation. There is no.evidence of significant arthropathy or other focal bone abnormality.    Patient Stated Goals Increase R hand function.    Currently in Pain? No/denies    Pain Score 0-No pain    Pain Onset More than a month ago               Treatment    *All treatment completed seated with the R shoulder abd to ~90 deg and elbow flexion to ~90 deg*  Putty gripping and finger pull apart with progression from yellow putty to green putty. X20 each movement   R Digit 2-5 finger flexion with PT resistance and PT OP for terminal flexion with a wrist flexion to promote greater tissue mobility of the dorsal soft tissue of the hand. X12 each finger  R Digit 2-5 finger opposition x10 each finger with PT OP at end range.   RUE Fine motor task: nut and bolt build up and break down x10 each task. Verbal and contact cueing to facilitate optimal tissue loading and  correct biomechanical alignment to ensure efficient and safe movement.       PT Education - 12/13/20 1156     Education Details Pt was educated on the importance of desensitization massage to the R hand.    Person(s) Educated Patient    Methods Explanation;Demonstration    Comprehension Verbalized understanding;Returned demonstration                 PT Long Term Goals - 12/06/20 1555       PT LONG TERM GOAL #1   Title Pt will improve FOTO score to predicted value of 68 to measure self reported improvement in ADL function.    Baseline 48    Time 6    Period Weeks    Status New    Target Date 01/17/21      PT LONG TERM GOAL #2   Title Pt will improve R grip strength to 50# in the RUE in order to allow greater access to ADLs that involve functional carrying.    Baseline Grip strength: Grip strength: pre nerve slide R/L: 17.8/88.3. post nerve slide  R/L 25.4/88.3.    Time 6    Period Weeks    Status New    Target Date 01/17/21      PT LONG TERM GOAL #3   Title Pt will increase strength by at least 1/2 MMT grade in order to demonstrate improvement in strength and function    Baseline UE MMT: R/L  4-/4- Shoulder flexion. 4-/4- Shoulder abduction. 4+/4+ Shoulder external rotation. 4+/4+  Shoulder internal rotation. 5/5 Elbow flexion. 4+/4+  Elbow extension  5/5 Wrist Extension. 5/5 Wrist Flexion. 5/5 Finger adduction.    Time 6    Period Weeks    Status New    Target Date 01/17/21      PT LONG TERM GOAL #4   Title Pt will improve R hand AROM equal to L to allow proper grasp of house hold objects.    Baseline AROM R/L: 45/90 Finger flexion (90/90 PROM).    Time 6    Period Weeks    Status New    Target Date 01/17/21                Plan - 12/13/20 1158     Clinical Impression Statement Today's tx was focused on progression of fine motor task and strengthening of the R hand. Pt was given a greater resistance putty 2/2 easy of HEP. Pt continues to display mark R hand endurance, strength, and ROM deficits resulting in decreased safe home/community mobiity, increased pain, and limited access to QOL. Pt. will continue to benefit from skilled physical therapy to progress POC to address remaining deficits to facilitate maximum functional capacity for optimal personal health and wellness for ADLs.    Personal Factors and Comorbidities Age;Comorbidity 3+;Behavior Pattern;Fitness    Comorbidities see PMH    Examination-Activity Limitations Bathing;Carry;Hygiene/Grooming;Dressing    Examination-Participation Restrictions Interpersonal Relationship;Yard Work;Laundry;Cleaning;Driving    Stability/Clinical Decision Making Evolving/Moderate complexity    Clinical Decision Making Moderate    Rehab Potential Fair    PT Frequency 1x / week    PT Duration 6 weeks    PT Treatment/Interventions ADLs/Self Care Home  Management;Biofeedback;Electrical Stimulation;Moist Heat;Traction;Ultrasound;Fluidtherapy;Therapeutic activities;Functional mobility training;Therapeutic exercise;Balance training;Neuromuscular re-education;Manual techniques;Passive range of motion;Dry needling;Spinal Manipulations;Joint Manipulations    PT Next Visit Plan Reassess HEP loading    PT Home Exercise Plan [email or web address]    Consulted and Agree with Plan of Care Patient  Patient will benefit from skilled therapeutic intervention in order to improve the following deficits and impairments:  Decreased activity tolerance, Decreased coordination, Decreased endurance, Decreased mobility, Decreased range of motion, Decreased safety awareness, Decreased strength, Hypomobility, Impaired perceived functional ability, Impaired tone, Impaired flexibility, Impaired sensation, Impaired UE functional use, Improper body mechanics, Pain  Visit Diagnosis: Carpal tunnel syndrome of right wrist  Decreased grip strength of right hand  Pain in right hand  Weakness of right hand     Problem List Patient Active Problem List   Diagnosis Date Noted   Carpal tunnel syndrome on right 12/01/2020   Median nerve dysfunction, right 10/27/2020   Chronic kidney disease, stage 3 unspecified (HCC) 07/23/2019   Dizziness 12/09/2018   Bradycardia 07/28/2015   Aortic atherosclerosis (HCC) 07/19/2015   Anemia of renal disease 07/19/2015   Heart valve disease 01/18/2015   Endocarditis, valve unspecified 01/18/2015   Familial multiple lipoprotein-type hyperlipidemia 07/05/2014   Pericarditis associated with severe chronic anemia 07/05/2014   Coronary atherosclerosis 07/05/2014   A-fib (HCC) 07/05/2014   Gout 07/05/2014   BP (high blood pressure) 07/05/2014   Adult hypothyroidism 07/05/2014   Arthritis of knee, degenerative 07/05/2014   Benign essential HTN 05/20/2014   Cammie Mcgee, PT, DPT # [id number] Lawernce Ion, SPT 12/14/2020, 10:08  AM  [facility] [address] [place], [place], [address] Phone: [phone]   Fax:  [phone]  Name: [name] MRN: [id number] Date of Birth: [birth year]

## 2020-12-15 ENCOUNTER — Other Ambulatory Visit: Payer: Self-pay | Admitting: Family Medicine

## 2020-12-15 DIAGNOSIS — G5611 Other lesions of median nerve, right upper limb: Secondary | ICD-10-CM

## 2020-12-16 ENCOUNTER — Other Ambulatory Visit: Payer: Self-pay

## 2020-12-16 ENCOUNTER — Telehealth: Payer: Self-pay

## 2020-12-16 DIAGNOSIS — G5611 Other lesions of median nerve, right upper limb: Secondary | ICD-10-CM

## 2020-12-16 DIAGNOSIS — G5601 Carpal tunnel syndrome, right upper limb: Secondary | ICD-10-CM

## 2020-12-16 MED ORDER — GABAPENTIN 300 MG PO CAPS: 300.0000 mg | ORAL_CAPSULE | Freq: Every day | ORAL | 0 refills | Status: DC

## 2020-12-16 NOTE — Telephone Encounter (Signed)
Patient needs a follow-up appointment around 01/24/21.  Please schedule.

## 2020-12-16 NOTE — Telephone Encounter (Signed)
Requested medication (s) are due for refill today:   No however he does not have enough to last until his appt on 01/26/2021  Requested medication (s) are on the active medication list:   Yes  Future visit scheduled:   Yes   Last ordered: 12/01/2020 #42, 0 refills  Returned-see note above    Requested Prescriptions  Pending Prescriptions Disp Refills   gabapentin (NEURONTIN) 100 MG capsule [Pharmacy Med Name: GABAPENTIN 100 MG CAPSULE] 42 capsule 0    Sig: Take 1 capsule every AM and 3 capsules every PM     Neurology: Anticonvulsants - gabapentin Passed - 12/15/2020  3:14 PM      Passed - Valid encounter within last 12 months    Recent Outpatient Visits           2 weeks ago Carpal tunnel syndrome on right   [facility] Jerrol Banana, MD   4 weeks ago Median nerve dysfunction, right   [facility] Jerrol Banana, MD   1 month ago Median nerve dysfunction, right   [facility] Jerrol Banana, MD   1 month ago Carpal tunnel syndrome on right   [facility] Duanne Limerick, MD   4 months ago Essential hypertension   [facility] Duanne Limerick, MD       Future Appointments             In 1 month Duanne Limerick, MD [facility], [facility]

## 2020-12-16 NOTE — Telephone Encounter (Signed)
Copied from CRM [id number]. Topic: General - Other >> Dec 16, 2020  8:12 AM Traci Sermon wrote: Reason for CRM: Pt called in wanting to speak with PCP nurse, pt did not say what he wanted to talk about, just stated he had a question, please advise.

## 2020-12-18 ENCOUNTER — Other Ambulatory Visit: Payer: Self-pay | Admitting: Family Medicine

## 2020-12-18 NOTE — Telephone Encounter (Signed)
Requested medication (s) are due for refill today: yes  Requested medication (s) are on the active medication list: yes  Last refill:  09/20/20 #270  Future visit scheduled: yes  Notes to clinic:  elevated creatinine/ overdue Hgb by 2 years   Requested Prescriptions  Pending Prescriptions Disp Refills   indomethacin (INDOCIN) 50 MG capsule [Pharmacy Med Name: INDOMETHACIN 50 MG CAPSULE] 270 capsule 0    Sig: TAKE 1 CAPSULE BY MOUTH 3 TIMES DAILY WITH MEALS.     Analgesics:  NSAIDS Failed - 12/18/2020 12:44 AM      Failed - Cr in normal range and within 360 days    Creatinine  Date Value Ref Range Status  02/21/2013 2.12 (H) 0.60 - 1.30 mg/dL Final   Creatinine, Ser  Date Value Ref Range Status  07/27/2020 1.98 (H) 0.76 - 1.27 mg/dL Final          Failed - HGB in normal range and within 360 days    Hemoglobin  Date Value Ref Range Status  07/24/2018 14.0 13.0 - 17.7 g/dL Final          Passed - Patient is not pregnant      Passed - Valid encounter within last 12 months    Recent Outpatient Visits           2 weeks ago Carpal tunnel syndrome on right   [facility] Jerrol Banana, MD   1 month ago Median nerve dysfunction, right   [facility] Jerrol Banana, MD   1 month ago Median nerve dysfunction, right   [facility] Jerrol Banana, MD   1 month ago Carpal tunnel syndrome on right   [facility] Duanne Limerick, MD   4 months ago Essential hypertension   [facility] Duanne Limerick, MD       Future Appointments             In 1 month Ashley Royalty, Ocie Bob, MD [facility], [facility]   In 1 month Duanne Limerick, MD [facility], [facility]

## 2020-12-20 ENCOUNTER — Ambulatory Visit: Payer: Medicare Other | Attending: Family Medicine | Admitting: Physical Therapy

## 2020-12-20 ENCOUNTER — Encounter: Payer: Self-pay | Admitting: Physical Therapy

## 2020-12-20 ENCOUNTER — Other Ambulatory Visit: Payer: Self-pay

## 2020-12-20 DIAGNOSIS — M79641 Pain in right hand: Secondary | ICD-10-CM | POA: Insufficient documentation

## 2020-12-20 DIAGNOSIS — R29898 Other symptoms and signs involving the musculoskeletal system: Secondary | ICD-10-CM | POA: Insufficient documentation

## 2020-12-20 DIAGNOSIS — G5601 Carpal tunnel syndrome, right upper limb: Secondary | ICD-10-CM | POA: Diagnosis not present

## 2020-12-20 NOTE — Therapy (Signed)
[facility] [address] [place], [place], [address] Phone: [phone]   Fax:  [phone]  Physical Therapy Treatment  Patient Details  Name: [name] MRN: [id number] Date of Birth: 01-21-1946 Referring Provider (PT): Dr. Joseph Berkshire MD   Encounter Date: 12/20/2020   PT End of Session - 12/20/20 1108     Visit Number 3    Number of Visits 6    Date for PT Re-Evaluation 01/17/21    Authorization - Visit Number 3    Authorization - Number of Visits 10    Progress Note Due on Visit 10    PT Start Time 1023    PT Stop Time 1104    PT Time Calculation (min) 41 min    Equipment Utilized During Treatment Other (comment)   Hand Putty   Activity Tolerance Patient tolerated treatment well;Patient limited by fatigue;No increased pain    Behavior During Therapy WFL for tasks assessed/performed             Past Medical History:  Diagnosis Date   Gout    Hyperlipidemia    Hypertension    Thyroid disease     Past Surgical History:  Procedure Laterality Date   APPENDECTOMY     CARDIAC SURGERY     quad. bypass   peptic ulcer     removed   TONSILLECTOMY      There were no vitals filed for this visit.   Subjective Assessment - 12/20/20 1026     Subjective Pt presents to tx with reports of increased functional mobility with the R hand including driving the tractor and holding a towel to dry himself off. Pt states that new putty is a good resistance. Pt states continued numbness in his fingers, sharp pain during light touch, and weakness when opening a door.    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 Pt is currently taking gabapentin for nerve related pain. PMH: A-fib, quad bypass, brady cardia, to name a few (See PMH for extensive list)    Limitations Lifting;House hold activities    How long can you sit comfortably? WNL    How long can you stand comfortably? WNL    How long can you  walk comfortably? WNL    Diagnostic tests x-ray 10/25/2020: and [place]: 11/17/2020. FINDINGS:  There is no evidence of fracture or dislocation. There is no.evidence of significant arthropathy or other focal bone abnormality.    Patient Stated Goals Increase R hand function.    Currently in Pain? No/denies    Pain Score 0-No pain    Pain Onset More than a month ago                Treatment    *All treatment completed seated with the R shoulder abd to ~90 deg and elbow flexion to ~90 deg*   Treatment      *All treatment completed seated with the R shoulder abd to ~90 deg and elbow flexion to ~90 deg*   2# wrist flexion/extension/ulnar/radial x30 with towel placed at the distal radius/ulna   R Digit 1-5-5 finger flexion with PT resistance and PT OP for terminal flexion with a wrist flexion to promote greater tissue mobility of the dorsal soft tissue of the hand. X12 each finger   Small hammer Supination Pronation with towel placed at the distal radius/ulna x20 each direction. Verbal and contact cueing to facilitate optimal tissue loading and correct biomechanical alignment  to ensure efficient and safe movement.   Grip with PSI feedback to 3PSI for 5 sec holds x 12. Verbal and contact cueing to facilitate optimal tissue loading and correct biomechanical alignment to ensure efficient and safe movement.      PT Long Term Goals - 12/06/20 1555       PT LONG TERM GOAL #1   Title Pt will improve FOTO score to predicted value of 68 to measure self reported improvement in ADL function.    Baseline 48    Time 6    Period Weeks    Status New    Target Date 01/17/21      PT LONG TERM GOAL #2   Title Pt will improve R grip strength to 50# in the RUE in order to allow greater access to ADLs that involve functional carrying.    Baseline Grip strength: Grip strength: pre nerve slide R/L: 17.8/88.3. post nerve slide R/L 25.4/88.3.    Time 6    Period Weeks    Status New    Target Date  01/17/21      PT LONG TERM GOAL #3   Title Pt will increase strength by at least 1/2 MMT grade in order to demonstrate improvement in strength and function    Baseline UE MMT: R/L  4-/4- Shoulder flexion. 4-/4- Shoulder abduction. 4+/4+ Shoulder external rotation. 4+/4+  Shoulder internal rotation. 5/5 Elbow flexion. 4+/4+  Elbow extension  5/5 Wrist Extension. 5/5 Wrist Flexion. 5/5 Finger adduction.    Time 6    Period Weeks    Status New    Target Date 01/17/21      PT LONG TERM GOAL #4   Title Pt will improve R hand AROM equal to L to allow proper grasp of house hold objects.    Baseline AROM R/L: 45/90 Finger flexion (90/90 PROM).    Time 6    Period Weeks    Status New    Target Date 01/17/21                   Plan - 12/20/20 1544     Clinical Impression Statement Today's tx was focused on increased functional hand strengthening with PSI feedback and hummer supination/pronation. Pt continues to display mark R hand endurance, strength, and ROM deficits within the median nerve distribution resulting in decreased safe home/community mobiity, increased pain, and limited access to QOL. Pt. will continue to benefit from skilled physical therapy to progress POC to address remaining deficits to facilitate maximum functional capacity for optimal personal health and wellness for ADLs.    Personal Factors and Comorbidities Age;Comorbidity 3+;Behavior Pattern;Fitness    Comorbidities see PMH    Examination-Activity Limitations Bathing;Carry;Hygiene/Grooming;Dressing    Examination-Participation Restrictions Interpersonal Relationship;Yard Work;Laundry;Cleaning;Driving    Stability/Clinical Decision Making Evolving/Moderate complexity    Clinical Decision Making Moderate    Rehab Potential Fair    PT Frequency 1x / week    PT Duration 6 weeks    PT Treatment/Interventions ADLs/Self Care Home Management;Biofeedback;Electrical Stimulation;Moist  Heat;Traction;Ultrasound;Fluidtherapy;Therapeutic activities;Functional mobility training;Therapeutic exercise;Balance training;Neuromuscular re-education;Manual techniques;Passive range of motion;Dry needling;Spinal Manipulations;Joint Manipulations    PT Next Visit Plan Reassess HEP loading    PT Home Exercise Plan [email or web address]    Consulted and Agree with Plan of Care Patient             Patient will benefit from skilled therapeutic intervention in order to improve the following deficits and impairments:  Decreased activity tolerance, Decreased coordination, Decreased endurance, Decreased  mobility, Decreased range of motion, Decreased safety awareness, Decreased strength, Hypomobility, Impaired perceived functional ability, Impaired tone, Impaired flexibility, Impaired sensation, Impaired UE functional use, Improper body mechanics, Pain  Visit Diagnosis: Carpal tunnel syndrome of right wrist  Decreased grip strength of right hand  Pain in right hand  Weakness of right hand     Problem List Patient Active Problem List   Diagnosis Date Noted   Carpal tunnel syndrome on right 12/01/2020   Median nerve dysfunction, right 10/27/2020   Chronic kidney disease, stage 3 unspecified ([place]) 07/23/2019   Dizziness 12/09/2018   Bradycardia 07/28/2015   Aortic atherosclerosis ([place]) 07/19/2015   Anemia of renal disease 07/19/2015   Heart valve disease 01/18/2015   Endocarditis, valve unspecified 01/18/2015   Familial multiple lipoprotein-type hyperlipidemia 07/05/2014   Pericarditis associated with severe chronic anemia 07/05/2014   Coronary atherosclerosis 07/05/2014   A-fib ([place]) 07/05/2014   Gout 07/05/2014   BP (high blood pressure) 07/05/2014   Adult hypothyroidism 07/05/2014   Arthritis of knee, degenerative 07/05/2014   Benign essential HTN 05/20/2014   Pura Spice, PT, DPT # [id number] Fara Olden, SPT 12/21/2020, 7:15 AM  [facility] [address] [place], [place], [address] Phone: [phone]   Fax:  [phone]  Name: PAULL SCHRIMSHER MRN: [id number] Date of Birth: 06-06-1945

## 2020-12-21 NOTE — Telephone Encounter (Signed)
Pt would like Delice Bison to give him a call.  He declined to give any other information, and said she should have my number.

## 2020-12-27 ENCOUNTER — Encounter: Payer: Medicare Other | Admitting: Physical Therapy

## 2020-12-30 ENCOUNTER — Other Ambulatory Visit: Payer: Self-pay

## 2020-12-30 ENCOUNTER — Ambulatory Visit (INDEPENDENT_AMBULATORY_CARE_PROVIDER_SITE_OTHER): Payer: Medicare Other

## 2020-12-30 DIAGNOSIS — Z23 Encounter for immunization: Secondary | ICD-10-CM

## 2021-01-03 ENCOUNTER — Other Ambulatory Visit: Payer: Self-pay

## 2021-01-03 ENCOUNTER — Encounter: Payer: Self-pay | Admitting: Physical Therapy

## 2021-01-03 ENCOUNTER — Ambulatory Visit: Payer: Medicare Other | Admitting: Physical Therapy

## 2021-01-03 DIAGNOSIS — R29898 Other symptoms and signs involving the musculoskeletal system: Secondary | ICD-10-CM

## 2021-01-03 DIAGNOSIS — G5601 Carpal tunnel syndrome, right upper limb: Secondary | ICD-10-CM

## 2021-01-03 DIAGNOSIS — M79641 Pain in right hand: Secondary | ICD-10-CM | POA: Diagnosis not present

## 2021-01-03 NOTE — Therapy (Signed)
[place] [facility] [address] [place], [place], [address] Phone: [phone]   Fax:  [phone]  Physical Therapy Treatment and Discharge [place] 12/06/2020-01/03/2021  Patient Details  Name: [name] MRN: [id number] Date of Birth: 10-27-1945 Referring Provider (PT): Dr. Rosette Reveal MD   Encounter Date: 01/03/2021   PT End of Session - 01/03/21 1118     Visit Number 4    Number of Visits 6    Date for PT Re-Evaluation 01/17/21    Authorization - Visit Number 4    Authorization - Number of Visits 10    Progress Note Due on Visit 10    PT Start Time 1019    PT Stop Time 1103    PT Time Calculation (min) 44 min    Equipment Utilized During Treatment Other (comment)   Hand Putty   Activity Tolerance Patient tolerated treatment well;Patient limited by fatigue;No increased pain    Behavior During Therapy WFL for tasks assessed/performed             Past Medical History:  Diagnosis Date   Gout    Hyperlipidemia    Hypertension    Thyroid disease     Past Surgical History:  Procedure Laterality Date   APPENDECTOMY     CARDIAC SURGERY     quad. bypass   peptic ulcer     removed   TONSILLECTOMY      There were no vitals filed for this visit.   Subjective Assessment - 01/03/21 1030     Subjective Pt presents to tx with continued reports of increase R hand function with greater grasp and fine motor abilities. Pt states that he still experiences pain to the light touch in the median nerve distribution, however, the freq of pain is decreasing. Pt is agreeable and excited for d/c today.    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 Pt is currently taking gabapentin for nerve related pain. PMH: A-fib, quad bypass, brady cardia, to name a few (See PMH for extensive list)    Limitations Lifting;House hold activities    How long can you sit comfortably? WNL    How long can you stand  comfortably? WNL    How long can you walk comfortably? WNL    Diagnostic tests x-ray 10/25/2020: and [place]: 11/17/2020. FINDINGS:  There is no evidence of fracture or dislocation. There is no.evidence of significant arthropathy or other focal bone abnormality.    Patient Stated Goals Increase R hand function.    Currently in Pain? No/denies    Pain Score 0-No pain    Pain Onset More than a month ago               Treatment and reassessment for d/c    FOTO: 63  Grip: R/L 51.7/88.3  UE MMT: R/L 4-/4- Shoulder flexion. 4-/4- Shoulder abduction. 4+/4+ Shoulder external rotation. 4+/4+ Shoulder internal rotation. 5/5 Elbow flexion. 4+/4+ Elbow extension 5/5 Wrist Extension. 5/5 Wrist Flexion. 5/5 Finger adduction.   AROM hand: 90/90    Pt was given updated HEP to address poor bilat shoulder strength to support hand function   Access Code: [email or web address] URL: [email or web address] Date: 01/03/2021 Prepared by: Dorcas Carrow  Exercises   Towel Roll Squeeze - 1 x daily - 5 x weekly - 3 sets - 10 reps Standing Median Nerve Glide - 1 x daily - 5 x weekly - 3 sets - 10 reps Putty Squeezes - 1  x daily - 5 x weekly - 3 sets - 10 reps Finger Pinch and Pull with Putty - 1 x daily - 5 x weekly - 3 sets - 10 reps Seated Wrist Extension with Overpressure - 1 x daily - 5 x weekly - 3 sets - 30 hold Scapular Retraction with Resistance - 1 x daily - 3 x weekly - 3 sets - 10 reps Standing Shoulder Horizontal Abduction with Resistance - 1 x daily - 3 x weekly - 3 sets - 10 reps Seated Single Arm Shoulder Scaption - 1 x daily - 3 x weekly - 3 sets - 10 reps Standing Alternating Shoulder Flexion - 1 x daily - 3 x weekly - 3 sets - 10 reps    PT Long Term Goals - 01/03/21 1029       PT LONG TERM GOAL #1   Title Pt will improve FOTO score to predicted value of 68 to measure self reported improvement in ADL function.    Baseline 48 11/15: 63    Time 6    Period Weeks    Status  Partially Met    Target Date 01/03/21      PT LONG TERM GOAL #2   Title Pt will improve R grip strength to 50# in the RUE in order to allow greater access to ADLs that involve functional carrying.    Baseline Grip strength: Grip strength: pre nerve slide R/L: 17.8/88.3. post nerve slide R/L 25.4/88.3. 11/15: 51.7/88.3    Time 6    Period Weeks    Status Achieved    Target Date 01/03/21      PT LONG TERM GOAL #3   Title Pt will increase strength by at least 1/2 MMT grade in order to demonstrate improvement in strength and function    Baseline UE MMT: R/L  4-/4- Shoulder flexion. 4-/4- Shoulder abduction. 4+/4+ Shoulder external rotation. 4+/4+  Shoulder internal rotation. 5/5 Elbow flexion. 4+/4+  Elbow extension  5/5 Wrist Extension. 5/5 Wrist Flexion. 5/5 Finger adduction. 11/15: R/L  4-/4- Shoulder flexion. 4-/4- Shoulder abduction. 4+/4+ Shoulder external rotation. 4+/4+  Shoulder internal rotation. 5/5 Elbow flexion. 4+/4+  Elbow extension  5/5 Wrist Extension. 5/5 Wrist Flexion. 5/5 Finger adduction.    Time 6    Period Weeks    Status Not Met    Target Date 01/03/21      PT LONG TERM GOAL #4   Title Pt will improve R hand AROM equal to L to allow proper grasp of house hold objects.    Baseline AROM R/L: 45/90 Finger flexion (90/90 PROM). 11/15: 90/90    Time 6    Period Weeks    Status Achieved    Target Date 01/03/21                   Plan - 01/03/21 1118     Clinical Impression Statement Pt was reassessed and d/c on this date 01/03/21. Reassessment of goals displayed the following: FOTO: 63. Grip: R/L 51.7/88.3. UE MMT: R/L 4-/4- Shoulder flexion. 4-/4- Shoulder abduction. 4+/4+ Shoulder external rotation. 4+/4+ Shoulder internal rotation. 5/5 Elbow flexion. 4+/4+ Elbow extension 5/5 Wrist Extension. 5/5 Wrist Flexion. 5/5 Finger adduction. AROM hand: 90/90.   6MWT: 1557ft with only minor bilat calf buring in the last 30 secs. Hamstring Mobility: Hamstrings: R:0  degrees L:0. . Pt was discharged with good prognosis to maintain therapeutic gains with correct HEP adherence. Pt was educated on return PT if a dramatic decreased in  functional status was noted.    Personal Factors and Comorbidities Age;Comorbidity 3+;Behavior Pattern;Fitness    Comorbidities see PMH    Examination-Activity Limitations Bathing;Carry;Hygiene/Grooming;Dressing    Examination-Participation Restrictions Interpersonal Relationship;Yard Work;Laundry;Cleaning;Driving    Stability/Clinical Decision Making Evolving/Moderate complexity    Clinical Decision Making Moderate    Rehab Potential Fair    PT Frequency 1x / week    PT Duration 6 weeks    PT Treatment/Interventions ADLs/Self Care Home Management;Biofeedback;Electrical Stimulation;Moist Heat;Traction;Ultrasound;Fluidtherapy;Therapeutic activities;Functional mobility training;Therapeutic exercise;Balance training;Neuromuscular re-education;Manual techniques;Passive range of motion;Dry needling;Spinal Manipulations;Joint Manipulations    PT Next Visit Plan pt was d/c    PT Home Exercise Plan [email or web address]    Consulted and Agree with Plan of Care Patient             Patient will benefit from skilled therapeutic intervention in order to improve the following deficits and impairments:  Decreased activity tolerance, Decreased coordination, Decreased endurance, Decreased mobility, Decreased range of motion, Decreased safety awareness, Decreased strength, Hypomobility, Impaired perceived functional ability, Impaired tone, Impaired flexibility, Impaired sensation, Impaired UE functional use, Improper body mechanics, Pain  Visit Diagnosis: Carpal tunnel syndrome of right wrist  Decreased grip strength of right hand  Pain in right hand  Weakness of right hand     Problem List Patient Active Problem List   Diagnosis Date Noted   Carpal tunnel syndrome on right 12/01/2020   Median nerve dysfunction, right 10/27/2020   Chronic  kidney disease, stage 3 unspecified ([place]) 07/23/2019   Dizziness 12/09/2018   Bradycardia 07/28/2015   Aortic atherosclerosis ([place]) 07/19/2015   Anemia of renal disease 07/19/2015   Heart valve disease 01/18/2015   Endocarditis, valve unspecified 01/18/2015   Familial multiple lipoprotein-type hyperlipidemia 07/05/2014   Pericarditis associated with severe chronic anemia 07/05/2014   Coronary atherosclerosis 07/05/2014   A-fib ([place]) 07/05/2014   Gout 07/05/2014   BP (high blood pressure) 07/05/2014   Adult hypothyroidism 07/05/2014   Arthritis of knee, degenerative 07/05/2014   Benign essential HTN 05/20/2014   Pura Spice, PT, DPT # [id number] Fara Olden, SPT 01/03/2021, 12:13 PM  [facility] [address] [place], [place], [address] Phone: [phone]   Fax:  [phone]  Name: [name] MRN: [id number] Date of Birth: May 03, 1945

## 2021-01-03 NOTE — Patient Instructions (Signed)
Access Code: [email or web address] URL: [email or web address] Date: 01/03/2021 Prepared by: Dorene Grebe  Exercises  Towel Roll Squeeze - 1 x daily - 5 x weekly - 3 sets - 10 reps Standing Median Nerve Glide - 1 x daily - 5 x weekly - 3 sets - 10 reps Putty Squeezes - 1 x daily - 5 x weekly - 3 sets - 10 reps Finger Pinch and Pull with Putty - 1 x daily - 5 x weekly - 3 sets - 10 reps Seated Wrist Extension with Overpressure - 1 x daily - 5 x weekly - 3 sets - 30 hold Scapular Retraction with Resistance - 1 x daily - 3 x weekly - 3 sets - 10 reps Standing Shoulder Horizontal Abduction with Resistance - 1 x daily - 3 x weekly - 3 sets - 10 reps Seated Single Arm Shoulder Scaption - 1 x daily - 3 x weekly - 3 sets - 10 reps Standing Alternating Shoulder Flexion - 1 x daily - 3 x weekly - 3 sets - 10 reps

## 2021-01-10 ENCOUNTER — Encounter: Payer: Medicare Other | Admitting: Physical Therapy

## 2021-01-12 ENCOUNTER — Other Ambulatory Visit: Payer: Self-pay | Admitting: Family Medicine

## 2021-01-12 DIAGNOSIS — M659 Synovitis and tenosynovitis, unspecified: Secondary | ICD-10-CM

## 2021-01-12 DIAGNOSIS — G5601 Carpal tunnel syndrome, right upper limb: Secondary | ICD-10-CM

## 2021-01-12 DIAGNOSIS — M65831 Other synovitis and tenosynovitis, right forearm: Secondary | ICD-10-CM

## 2021-01-12 NOTE — Telephone Encounter (Signed)
Requested medications are due for refill today.  yes  Requested medications are on the active medications list.  yes  Last refill. 11/17/2020  Future visit scheduled.   yes  Notes to clinic.  Failed protocol d/t expired / abnormal labs.

## 2021-01-16 ENCOUNTER — Other Ambulatory Visit: Payer: Self-pay

## 2021-01-24 ENCOUNTER — Ambulatory Visit: Payer: Medicare Other | Admitting: Family Medicine

## 2021-01-26 ENCOUNTER — Encounter: Payer: Self-pay | Admitting: Family Medicine

## 2021-01-26 ENCOUNTER — Other Ambulatory Visit: Payer: Self-pay

## 2021-01-26 ENCOUNTER — Ambulatory Visit (INDEPENDENT_AMBULATORY_CARE_PROVIDER_SITE_OTHER): Payer: Medicare Other | Admitting: Family Medicine

## 2021-01-26 VITALS — BP 124/78 | HR 72 | Ht 71.0 in | Wt 197.0 lb

## 2021-01-26 DIAGNOSIS — I10 Essential (primary) hypertension: Secondary | ICD-10-CM | POA: Diagnosis not present

## 2021-01-26 DIAGNOSIS — E7849 Other hyperlipidemia: Secondary | ICD-10-CM

## 2021-01-26 DIAGNOSIS — I480 Paroxysmal atrial fibrillation: Secondary | ICD-10-CM

## 2021-01-26 DIAGNOSIS — M1A9XX0 Chronic gout, unspecified, without tophus (tophi): Secondary | ICD-10-CM | POA: Diagnosis not present

## 2021-01-26 DIAGNOSIS — E039 Hypothyroidism, unspecified: Secondary | ICD-10-CM

## 2021-01-26 MED ORDER — METOPROLOL SUCCINATE ER 50 MG PO TB24: 50.0000 mg | ORAL_TABLET | Freq: Every day | ORAL | 1 refills | Status: DC

## 2021-01-26 MED ORDER — OLMESARTAN MEDOXOMIL-HCTZ 40-25 MG PO TABS: 1.0000 | ORAL_TABLET | Freq: Every day | ORAL | 1 refills | Status: DC

## 2021-01-26 MED ORDER — LOVASTATIN 20 MG PO TABS: 20.0000 mg | ORAL_TABLET | Freq: Every day | ORAL | 1 refills | Status: DC

## 2021-01-26 MED ORDER — ALLOPURINOL 100 MG PO TABS: 100.0000 mg | ORAL_TABLET | Freq: Every day | ORAL | 1 refills | Status: DC

## 2021-01-26 MED ORDER — LEVOTHYROXINE SODIUM 100 MCG PO TABS: 100.0000 ug | ORAL_TABLET | Freq: Every day | ORAL | 1 refills | Status: DC

## 2021-01-26 NOTE — Progress Notes (Signed)
  Date:  01/26/2021   Name:  [name]   DOB:  04/18/1945   MRN:  [id number]   Chief Complaint: Hypertension, Hyperlipidemia, and Hypothyroidism  Hypertension This is a chronic problem. The current episode started more than 1 year ago. The problem has been gradually improving since onset. The problem is controlled. Pertinent negatives include no anxiety, blurred vision, chest pain, headaches, malaise/fatigue, neck pain, orthopnea, palpitations, peripheral edema, PND, shortness of breath or sweats. There are no associated agents to hypertension. There are no known risk factors for coronary artery disease. Past treatments include calcium channel blockers, angiotensin blockers, beta blockers and diuretics. The current treatment provides moderate improvement. There are no compliance problems.  There is no history of angina, kidney disease, CAD/MI, CVA, heart failure, left ventricular hypertrophy, PVD or retinopathy. Identifiable causes of hypertension include a thyroid problem. There is no history of chronic renal disease, a hypertension causing med or renovascular disease.  Hyperlipidemia This is a chronic problem. The current episode started more than 1 year ago. The problem is controlled. Recent lipid tests were reviewed and are normal. He has no history of chronic renal disease. Pertinent negatives include no chest pain, myalgias or shortness of breath. Current antihyperlipidemic treatment includes statins. The current treatment provides moderate improvement of lipids. There are no compliance problems.  Risk factors for coronary artery disease include hypertension.  Thyroid Problem Presents for follow-up visit. Patient reports no anxiety, cold intolerance, constipation, depressed mood, diaphoresis, diarrhea, dry skin, fatigue, hair loss, heat intolerance, hoarse voice, leg swelling, menstrual problem, nail problem, palpitations, tremors, visual change, weight gain or weight loss. The symptoms have  been stable. His past medical history is significant for hyperlipidemia. There is no history of heart failure.   Lab Results  Component Value Date   NA 141 07/27/2020   K 4.7 07/27/2020   CO2 19 (L) 07/27/2020   GLUCOSE 89 07/27/2020   BUN 28 (H) 07/27/2020   CREATININE 1.98 (H) 07/27/2020   CALCIUM 9.1 07/27/2020   EGFR 34 (L) 07/27/2020   GFRNONAA 32 (L) 01/26/2020   Lab Results  Component Value Date   CHOL 119 07/27/2020   HDL 33 (L) 07/27/2020   LDLCALC 61 07/27/2020   TRIG 140 07/27/2020   CHOLHDL 4.0 07/03/2017   Lab Results  Component Value Date   TSH 2.770 07/27/2020   No results found for: HGBA1C Lab Results  Component Value Date   WBC 7.6 07/24/2018   HGB 14.0 07/24/2018   HCT 43.7 07/24/2018   MCV 85 07/24/2018   PLT 212 07/24/2018   Lab Results  Component Value Date   ALT 12 07/27/2020   AST 17 07/27/2020   ALKPHOS 74 07/27/2020   BILITOT 0.7 07/27/2020   No results found for: 25OHVITD2, 25OHVITD3, VD25OH   Review of Systems  Constitutional:  Negative for chills, diaphoresis, fatigue, fever, malaise/fatigue, weight gain and weight loss.  HENT:  Negative for drooling, ear discharge, ear pain, hoarse voice and sore throat.   Eyes:  Negative for blurred vision.  Respiratory:  Negative for cough, shortness of breath and wheezing.   Cardiovascular:  Negative for chest pain, palpitations, orthopnea, leg swelling and PND.  Gastrointestinal:  Negative for abdominal pain, blood in stool, constipation, diarrhea and nausea.  Endocrine: Negative for cold intolerance, heat intolerance and polydipsia.  Genitourinary:  Negative for dysuria, frequency, hematuria, menstrual problem and urgency.  Musculoskeletal:  Negative for back pain, myalgias and neck pain.  Skin:  Negative for   rash.  Allergic/Immunologic: Negative for environmental allergies.  Neurological:  Negative for dizziness, tremors and headaches.  Hematological:  Does not bruise/bleed easily.   Psychiatric/Behavioral:  Negative for suicidal ideas. The patient is not nervous/anxious.    Patient Active Problem List   Diagnosis Date Noted   Carpal tunnel syndrome on right 12/01/2020   Median nerve dysfunction, right 10/27/2020   Chronic kidney disease, stage 3 unspecified ([place]) 07/23/2019   Dizziness 12/09/2018   Bradycardia 07/28/2015   Aortic atherosclerosis ([place]) 07/19/2015   Anemia of renal disease 07/19/2015   Heart valve disease 01/18/2015   Endocarditis, valve unspecified 01/18/2015   Familial multiple lipoprotein-type hyperlipidemia 07/05/2014   Pericarditis associated with severe chronic anemia 07/05/2014   Coronary atherosclerosis 07/05/2014   A-fib ([place]) 07/05/2014   Gout 07/05/2014   BP (high blood pressure) 07/05/2014   Adult hypothyroidism 07/05/2014   Arthritis of knee, degenerative 07/05/2014   Benign essential HTN 05/20/2014    No Known Allergies  Past Surgical History:  Procedure Laterality Date   APPENDECTOMY     CARDIAC SURGERY     quad. bypass   peptic ulcer     removed   TONSILLECTOMY      Social History   Tobacco Use   Smoking status: Never   Smokeless tobacco: Current    Types: Chew  Vaping Use   Vaping Use: Never used  Substance Use Topics   Alcohol use: Yes    Alcohol/week: 0.0 standard drinks   Drug use: Never     Medication list has been reviewed and updated.  Current Meds  Medication Sig   allopurinol (ZYLOPRIM) 100 MG tablet Take 1 tablet (100 mg total) by mouth daily.   aspirin 81 MG tablet Take 1 tablet (81 mg total) by mouth daily.   diltiazem (TIAZAC) 120 MG 24 hr capsule Take 1 capsule by mouth daily. kowalski   gabapentin (NEURONTIN) 100 MG capsule Take 1 capsule (100 mg total) by mouth daily.   gabapentin (NEURONTIN) 300 MG capsule Take 1 capsule (300 mg total) by mouth at bedtime.   levothyroxine (SYNTHROID) 100 MCG tablet Take 1 tablet (100 mcg total) by mouth daily.   lovastatin (MEVACOR) 20 MG tablet Take 1  tablet (20 mg total) by mouth daily.   metoprolol succinate (TOPROL-XL) 50 MG 24 hr tablet TAKE 1 TABLET BY MOUTH DAILY. TAKE WITH OR IMMEDIATELY FOLLOWING A MEAL.   olmesartan-hydrochlorothiazide (BENICAR HCT) 40-25 MG tablet Take 1 tablet by mouth daily.   sodium bicarbonate 650 MG tablet Take 650 mg by mouth 2 (two) times daily.   triamcinolone cream (KENALOG) 0.1 % APPLY TO AFFECTED AREA TWICE A DAY    PHQ 2/9 Scores 01/26/2021 12/01/2020 11/17/2020 10/27/2020  PHQ - 2 Score 0 0 0 0  PHQ- 9 Score 0 0 0 1    GAD 7 : Generalized Anxiety Score 01/26/2021 12/01/2020 11/17/2020 10/27/2020  Nervous, Anxious, on Edge 0 0 0 0  Control/stop worrying 0 0 0 0  Worry too much - different things 0 0 0 0  Trouble relaxing 0 0 0 0  Restless 0 0 0 0  Easily annoyed or irritable 0 0 0 0  Afraid - awful might happen 0 0 0 0  Total GAD 7 Score 0 0 0 0  Anxiety Difficulty - Not difficult at all Not difficult at all Not difficult at all    BP Readings from Last 3 Encounters:  01/26/21 124/78  12/01/20 122/64  11/17/20 128/78  Physical Exam Vitals and nursing note reviewed.  HENT:     Head: Normocephalic.     Right Ear: External ear normal.     Left Ear: External ear normal.     Nose: Nose normal.  Eyes:     General: No scleral icterus.       Right eye: No discharge.        Left eye: No discharge.     Conjunctiva/sclera: Conjunctivae normal.     Pupils: Pupils are equal, round, and reactive to light.  Neck:     Thyroid: No thyromegaly.     Vascular: No JVD.     Trachea: No tracheal deviation.  Cardiovascular:     Rate and Rhythm: Normal rate and regular rhythm.     Pulses: Normal pulses.     Heart sounds: Normal heart sounds, S1 normal and S2 normal. No murmur heard. No systolic murmur is present.  No diastolic murmur is present.    No friction rub. No gallop. No S3 or S4 sounds.  Pulmonary:     Effort: No respiratory distress.     Breath sounds: Normal breath sounds. No wheezing,  rhonchi or rales.  Abdominal:     General: Bowel sounds are normal.     Palpations: Abdomen is soft. There is no mass.     Tenderness: There is no abdominal tenderness. There is no guarding or rebound.  Musculoskeletal:        General: No tenderness. Normal range of motion.     Cervical back: Normal range of motion and neck supple.     Right lower leg: No edema.     Left lower leg: No edema.  Lymphadenopathy:     Cervical: No cervical adenopathy.  Skin:    General: Skin is warm.     Findings: No rash.  Neurological:     Mental Status: He is alert and oriented to person, place, and time.     Cranial Nerves: No cranial nerve deficit.     Deep Tendon Reflexes: Reflexes are normal and symmetric.    Wt Readings from Last 3 Encounters:  01/26/21 197 lb (89.4 kg)  12/01/20 198 lb (89.8 kg)  11/17/20 200 lb (90.7 kg)    BP 124/78   Pulse 72   Ht 5' 11" (1.803 m)   Wt 197 lb (89.4 kg)   BMI 27.48 kg/m   Assessment and Plan:  1. Essential hypertension Chronic.  Controlled.  Stable.  Blood pressure 124/78.  Continue metoprolol XL 50 mg once a day, diltiazem 120 mg once a day and olmesartan hydrochlorothiazide 40-25 mg 1 a day.  We will recheck renal function panel for electrolytes and GFR.  We will recheck patient in 6 months. - metoprolol succinate (TOPROL-XL) 50 MG 24 hr tablet; Take 1 tablet (50 mg total) by mouth daily. TAKE WITH OR IMMEDIATELY FOLLOWING A MEAL.  Dispense: 90 tablet; Refill: 1 - olmesartan-hydrochlorothiazide (BENICAR HCT) 40-25 MG tablet; Take 1 tablet by mouth daily.  Dispense: 90 tablet; Refill: 1 - Renal Function Panel  2. Familial multiple lipoprotein-type hyperlipidemia Chronic.  Controlled.  Stable.  Continue lovastatin 20 mg once a day. - lovastatin (MEVACOR) 20 MG tablet; Take 1 tablet (20 mg total) by mouth daily.  Dispense: 90 tablet; Refill: 1  3. Chronic gout without tophus, unspecified cause, unspecified site Chronic.  Controlled.  Stable.   Continue allopurinol 100 mg once a day. - allopurinol (ZYLOPRIM) 100 MG tablet; Take 1 tablet (100 mg total) by  mouth daily.  Dispense: 90 tablet; Refill: 1  4. Paroxysmal atrial fibrillation ([facility]) Chronic.  Controlled.  Stable.  Followed by cardiology.  Patient is currently taking aspirin as well as rate control with diltiazem.  Patient is also controlling thyroid with levothyroxine 100 mcg daily. - levothyroxine (SYNTHROID) 100 MCG tablet; Take 1 tablet (100 mcg total) by mouth daily.  Dispense: 90 tablet; Refill: 1   5.  Hypothyroid.  Patient is currently compensating with levothyroxine 100 mcg daily.  Patient with prefer not to do a TSH at this time and we will recheck this in 6 months

## 2021-01-27 LAB — RENAL FUNCTION PANEL
Albumin: 4.6 g/dL (ref 3.7–4.7)
BUN/Creatinine Ratio: 16 (ref 10–24)
BUN: 35 mg/dL — ABNORMAL HIGH (ref 8–27)
CO2: 21 mmol/L (ref 20–29)
Calcium: 9.6 mg/dL (ref 8.6–10.2)
Chloride: 102 mmol/L (ref 96–106)
Creatinine, Ser: 2.23 mg/dL — ABNORMAL HIGH (ref 0.76–1.27)
Glucose: 81 mg/dL (ref 70–99)
Phosphorus: 3.6 mg/dL (ref 2.8–4.1)
Potassium: 4.6 mmol/L (ref 3.5–5.2)
Sodium: 140 mmol/L (ref 134–144)
eGFR: 30 mL/min/{1.73_m2} — ABNORMAL LOW (ref >59)

## 2021-02-03 ENCOUNTER — Ambulatory Visit (INDEPENDENT_AMBULATORY_CARE_PROVIDER_SITE_OTHER): Payer: Medicare Other | Admitting: Family Medicine

## 2021-02-03 ENCOUNTER — Other Ambulatory Visit: Payer: Self-pay

## 2021-02-03 ENCOUNTER — Encounter: Payer: Self-pay | Admitting: Family Medicine

## 2021-02-03 VITALS — BP 102/72 | HR 45 | Ht 71.0 in | Wt 199.0 lb

## 2021-02-03 DIAGNOSIS — G5611 Other lesions of median nerve, right upper limb: Secondary | ICD-10-CM | POA: Diagnosis not present

## 2021-02-03 DIAGNOSIS — G5601 Carpal tunnel syndrome, right upper limb: Secondary | ICD-10-CM | POA: Diagnosis not present

## 2021-02-03 MED ORDER — GABAPENTIN 100 MG PO CAPS: 100.0000 mg | ORAL_CAPSULE | Freq: Every day | ORAL | 0 refills | Status: DC

## 2021-02-03 NOTE — Assessment & Plan Note (Signed)
Patient has demonstrated total resolution of symptomatology of the right hand in the median nerve distribution, able to perform ADLs without major issue.  That being said he does have issues with fine motor control and sensory deficits in the median nerve distribution noted on examination today.  No thenar atrophy, strength is 4/5 with resisted testing.  As such, I have advised various further evaluation management options.  He is not amenable to nerve conduction studies at this time, he is amenable to continued gabapentin with gradual wean at his preference, restart of night wrist brace, and compliance with daily home exercises.  We will recheck symptoms in 3 months.  If persistent sensory disturbance without improvement noted, can revisit nerve conduction studies.

## 2021-02-03 NOTE — Assessment & Plan Note (Signed)
See additional assessment(s) for plan details. 

## 2021-02-03 NOTE — Progress Notes (Signed)
°  ° °  Primary Care / Sports Medicine Office Visit  Patient Information:  Patient ID: [name], male DOB: 1945/05/23 Age: 75 y.o. MRN: [id number]   [name] is a pleasant 75 y.o. male presenting with the following:  Chief Complaint  Patient presents with   Carpal tunnel syndrome on right    100% better, but still no feeling in fingers on right hand; having difficulty with fine motor skills like buttoning a shirt; denies pain in office    Patient Active Problem List   Diagnosis Date Noted   Carpal tunnel syndrome on right 12/01/2020   Median nerve dysfunction, right 10/27/2020   Chronic kidney disease, stage 3 unspecified ([facility]) 07/23/2019   Dizziness 12/09/2018   Bradycardia 07/28/2015   Aortic atherosclerosis ([facility]) 07/19/2015   Anemia of renal disease 07/19/2015   Heart valve disease 01/18/2015   Endocarditis, valve unspecified 01/18/2015   Familial multiple lipoprotein-type hyperlipidemia 07/05/2014   Pericarditis associated with severe chronic anemia 07/05/2014   Coronary atherosclerosis 07/05/2014   A-fib ([facility]) 07/05/2014   Gout 07/05/2014   BP (high blood pressure) 07/05/2014   Adult hypothyroidism 07/05/2014   Arthritis of knee, degenerative 07/05/2014   Benign essential HTN 05/20/2014    Vitals:   02/03/21 1100  BP: 102/72  Pulse: (!) 45  SpO2: 95%   Vitals:   02/03/21 1100  Weight: 199 lb (90.3 kg)  Height: 5\' 11"  (1.803 m)   Body mass index is 27.75 kg/m.  No results found.   Independent interpretation of notes and tests performed by another provider:   None  Procedures performed:   None  Pertinent History, Exam, Impression, and Recommendations:   Median nerve dysfunction, right Patient has demonstrated total resolution of symptomatology of the right hand in the median nerve distribution, able to perform ADLs without major issue.  That being said he does have issues with fine motor control and sensory deficits in the median nerve  distribution noted on examination today.  No thenar atrophy, strength is 4/5 with resisted testing.  As such, I have advised various further evaluation management options.  He is not amenable to nerve conduction studies at this time, he is amenable to continued gabapentin with gradual wean at his preference, restart of night wrist brace, and compliance with daily home exercises.  We will recheck symptoms in 3 months.  If persistent sensory disturbance without improvement noted, can revisit nerve conduction studies.  Carpal tunnel syndrome on right See additional assessment(s) for plan details.   Orders & Medications Meds ordered this encounter  Medications   gabapentin (NEURONTIN) 100 MG capsule    Sig: Take 1 capsule (100 mg total) by mouth daily.    Dispense:  30 capsule    Refill:  0   No orders of the defined types were placed in this encounter.    Return in about 3 months (around 05/04/2021).     05/06/2021, MD   Primary Care Sports Medicine [facility]

## 2021-02-03 NOTE — Patient Instructions (Signed)
-   Wear wrist brace at night - Perform home exercises daily - Wean from gabapentin weekly then stop - Return in 3 months - Call for questions

## 2021-02-07 DIAGNOSIS — I10 Essential (primary) hypertension: Secondary | ICD-10-CM | POA: Diagnosis not present

## 2021-02-07 DIAGNOSIS — N1832 Chronic kidney disease, stage 3b: Secondary | ICD-10-CM | POA: Diagnosis not present

## 2021-02-28 DIAGNOSIS — R001 Bradycardia, unspecified: Secondary | ICD-10-CM | POA: Diagnosis not present

## 2021-02-28 DIAGNOSIS — I38 Endocarditis, valve unspecified: Secondary | ICD-10-CM | POA: Diagnosis not present

## 2021-02-28 DIAGNOSIS — I10 Essential (primary) hypertension: Secondary | ICD-10-CM | POA: Diagnosis not present

## 2021-02-28 DIAGNOSIS — E782 Mixed hyperlipidemia: Secondary | ICD-10-CM | POA: Diagnosis not present

## 2021-02-28 DIAGNOSIS — I25810 Atherosclerosis of coronary artery bypass graft(s) without angina pectoris: Secondary | ICD-10-CM | POA: Diagnosis not present

## 2021-02-28 DIAGNOSIS — I4892 Unspecified atrial flutter: Secondary | ICD-10-CM | POA: Diagnosis not present

## 2021-02-28 DIAGNOSIS — N1832 Chronic kidney disease, stage 3b: Secondary | ICD-10-CM | POA: Diagnosis not present

## 2021-02-28 DIAGNOSIS — I480 Paroxysmal atrial fibrillation: Secondary | ICD-10-CM | POA: Diagnosis not present

## 2021-03-20 DIAGNOSIS — N1832 Chronic kidney disease, stage 3b: Secondary | ICD-10-CM | POA: Diagnosis not present

## 2021-03-20 DIAGNOSIS — I10 Essential (primary) hypertension: Secondary | ICD-10-CM | POA: Diagnosis not present

## 2021-03-22 ENCOUNTER — Other Ambulatory Visit: Payer: Self-pay | Admitting: Nephrology

## 2021-03-22 DIAGNOSIS — R801 Persistent proteinuria, unspecified: Secondary | ICD-10-CM

## 2021-03-22 DIAGNOSIS — N1832 Chronic kidney disease, stage 3b: Secondary | ICD-10-CM

## 2021-03-22 DIAGNOSIS — I10 Essential (primary) hypertension: Secondary | ICD-10-CM

## 2021-04-25 DIAGNOSIS — I4892 Unspecified atrial flutter: Secondary | ICD-10-CM | POA: Diagnosis not present

## 2021-04-25 DIAGNOSIS — I25810 Atherosclerosis of coronary artery bypass graft(s) without angina pectoris: Secondary | ICD-10-CM | POA: Diagnosis not present

## 2021-04-25 DIAGNOSIS — E782 Mixed hyperlipidemia: Secondary | ICD-10-CM | POA: Diagnosis not present

## 2021-04-25 DIAGNOSIS — N1832 Chronic kidney disease, stage 3b: Secondary | ICD-10-CM | POA: Diagnosis not present

## 2021-04-25 DIAGNOSIS — R001 Bradycardia, unspecified: Secondary | ICD-10-CM | POA: Diagnosis not present

## 2021-04-25 DIAGNOSIS — I480 Paroxysmal atrial fibrillation: Secondary | ICD-10-CM | POA: Diagnosis not present

## 2021-04-25 DIAGNOSIS — I10 Essential (primary) hypertension: Secondary | ICD-10-CM | POA: Diagnosis not present

## 2021-05-04 ENCOUNTER — Ambulatory Visit: Payer: Medicare Other | Admitting: Family Medicine

## 2021-05-16 DIAGNOSIS — I10 Essential (primary) hypertension: Secondary | ICD-10-CM | POA: Diagnosis not present

## 2021-05-16 DIAGNOSIS — I4892 Unspecified atrial flutter: Secondary | ICD-10-CM | POA: Diagnosis not present

## 2021-05-16 DIAGNOSIS — I38 Endocarditis, valve unspecified: Secondary | ICD-10-CM | POA: Diagnosis not present

## 2021-05-16 DIAGNOSIS — N1832 Chronic kidney disease, stage 3b: Secondary | ICD-10-CM | POA: Diagnosis not present

## 2021-05-16 DIAGNOSIS — I480 Paroxysmal atrial fibrillation: Secondary | ICD-10-CM | POA: Diagnosis not present

## 2021-05-16 DIAGNOSIS — R001 Bradycardia, unspecified: Secondary | ICD-10-CM | POA: Diagnosis not present

## 2021-05-16 DIAGNOSIS — I25810 Atherosclerosis of coronary artery bypass graft(s) without angina pectoris: Secondary | ICD-10-CM | POA: Diagnosis not present

## 2021-06-13 MED ORDER — SODIUM CHLORIDE 0.9 % IV SOLN: INTRAVENOUS | Status: DC

## 2021-06-14 ENCOUNTER — Other Ambulatory Visit: Payer: Self-pay

## 2021-06-14 ENCOUNTER — Encounter
Admission: RE | Disposition: A | Discharge status: Home / Self Care | Payer: Self-pay | Source: Home / Self Care | Attending: Internal Medicine

## 2021-06-14 ENCOUNTER — Encounter: Payer: Self-pay | Admitting: Internal Medicine

## 2021-06-14 ENCOUNTER — Ambulatory Visit: Payer: Medicare Other | Admitting: Certified Registered"

## 2021-06-14 ENCOUNTER — Ambulatory Visit
Admission: RE | Admit: 2021-06-14 | Discharge: 2021-06-14 | Disposition: A | Discharge status: Home / Self Care | Payer: Medicare Other | Attending: Internal Medicine | Admitting: Internal Medicine

## 2021-06-14 DIAGNOSIS — I25810 Atherosclerosis of coronary artery bypass graft(s) without angina pectoris: Secondary | ICD-10-CM | POA: Insufficient documentation

## 2021-06-14 DIAGNOSIS — M109 Gout, unspecified: Secondary | ICD-10-CM | POA: Diagnosis not present

## 2021-06-14 DIAGNOSIS — N1832 Chronic kidney disease, stage 3b: Secondary | ICD-10-CM | POA: Insufficient documentation

## 2021-06-14 DIAGNOSIS — Z79899 Other long term (current) drug therapy: Secondary | ICD-10-CM | POA: Insufficient documentation

## 2021-06-14 DIAGNOSIS — I4892 Unspecified atrial flutter: Secondary | ICD-10-CM | POA: Insufficient documentation

## 2021-06-14 DIAGNOSIS — Z951 Presence of aortocoronary bypass graft: Secondary | ICD-10-CM | POA: Insufficient documentation

## 2021-06-14 DIAGNOSIS — E785 Hyperlipidemia, unspecified: Secondary | ICD-10-CM | POA: Insufficient documentation

## 2021-06-14 DIAGNOSIS — E039 Hypothyroidism, unspecified: Secondary | ICD-10-CM | POA: Insufficient documentation

## 2021-06-14 DIAGNOSIS — I129 Hypertensive chronic kidney disease with stage 1 through stage 4 chronic kidney disease, or unspecified chronic kidney disease: Secondary | ICD-10-CM | POA: Insufficient documentation

## 2021-06-14 DIAGNOSIS — I480 Paroxysmal atrial fibrillation: Secondary | ICD-10-CM

## 2021-06-14 DIAGNOSIS — I4891 Unspecified atrial fibrillation: Secondary | ICD-10-CM | POA: Diagnosis not present

## 2021-06-14 HISTORY — PX: CARDIOVERSION: SHX1299

## 2021-06-14 SURGERY — CARDIOVERSION
Anesthesia: General

## 2021-06-14 MED ORDER — PROPOFOL 10 MG/ML IV BOLUS
INTRAVENOUS | Status: DC | PRN
  Administered 2021-06-14: 20 mg via INTRAVENOUS
  Administered 2021-06-14: 50 mg via INTRAVENOUS

## 2021-06-14 MED ORDER — PROPOFOL 10 MG/ML IV BOLUS
INTRAVENOUS | Status: AC
  Filled 2021-06-14: qty 20

## 2021-06-14 MED ORDER — PROPOFOL 500 MG/50ML IV EMUL
INTRAVENOUS | Status: AC
  Filled 2021-06-14: qty 50

## 2021-06-14 MED ORDER — ONDANSETRON HCL 4 MG/2ML IJ SOLN: 4.0000 mg | Freq: Once | INTRAMUSCULAR | Status: DC | PRN

## 2021-06-14 MED ORDER — FENTANYL CITRATE (PF) 100 MCG/2ML IJ SOLN: 25.0000 ug | INTRAMUSCULAR | Status: DC | PRN

## 2021-06-14 NOTE — Transfer of Care (Signed)
Immediate Anesthesia Transfer of Care Note ? ?Patient: [name] ? ?Procedure(s) Performed: CARDIOVERSION ? ?Patient Location: PACU and Cath Lab ? ?Anesthesia Type:General ? ?Level of Consciousness: drowsy ? ?Airway & Oxygen Therapy: Patient Spontanous Breathing ? ?Post-op Assessment: Report given to RN ? ?Post vital signs: stable ? ?Last Vitals:  ?Vitals Value Taken Time  ?BP 111/85 06/14/21 0735  ?Temp    ?Pulse 127 06/14/21 0739  ?Resp 22 06/14/21 0739  ?SpO2 95 % 06/14/21 0739  ? ? ?Last Pain:  ?Vitals:  ? 06/14/21 0654  ?TempSrc: Oral  ?PainSc: 0-No pain  ?   ? ?  ? ?Complications: No notable events documented. ?

## 2021-06-14 NOTE — CV Procedure (Signed)
Electrical Cardioversion Procedure Note ?ARIUS HARNOIS ?[id number] ?1945/03/28 ? ?Procedure: Electrical Cardioversion ?Indications:  paroxysmal non valvular atrial flutter ? ?Procedure Details ?Consent: Risks of procedure as well as the alternatives and risks of each were explained to the (patient/caregiver).  Consent for procedure obtained. ?Time Out: Verified patient identification, verified procedure, site/side was marked, verified correct patient position, special equipment/implants available, medications/allergies/relevent history reviewed, required imaging and test results available.  Performed ? ?Patient placed on cardiac monitor, pulse oximetry, supplemental oxygen as necessary.  ?Sedation given: Propofol and versed as per anesthesia  ?Pacer pads placed anterior and posterior chest. ? ?Cardioverted 3 time(s).  ?Cardioverted at 150J. ? ?Evaluation ?Findings: Post procedure EKG shows: Atrial Flutter with continued lbbb as before and with 2 to 1 block  we had discussion about continued heart rate control and anticoagulaiton with also discussion of ablation for which he will contemplate ?Complications: None ?Patient did tolerate procedure well. ? ? ?Arnoldo Hooker M.D. FACC ?06/14/2021, 7:38 AM ? ? ? ?

## 2021-06-14 NOTE — Anesthesia Preprocedure Evaluation (Signed)
Anesthesia Evaluation  ?Patient identified by MRN, date of birth, ID band ?Patient awake ? ? ? ?Reviewed: ?Allergy & Precautions, H&P , NPO status , Patient's Chart, lab work & pertinent test results, reviewed documented beta blocker date and time  ? ?Airway ?Mallampati: II ? ? ?Neck ROM: full ? ? ? Dental ? ?(+) Upper Dentures, Lower Dentures ?  ?Pulmonary ?neg pulmonary ROS,  ?  ?Pulmonary exam normal ? ? ? ? ? ? ? Cardiovascular ?Exercise Tolerance: Good ?hypertension, On Medications ?(-) angina+ CAD and + CABG  ?Atrial Fibrillation  ?Rhythm:regular Rate:Normal ? ? ?  ?Neuro/Psych ? Neuromuscular disease negative psych ROS  ? GI/Hepatic ?negative GI ROS, Neg liver ROS,   ?Endo/Other  ?Hypothyroidism  ? Renal/GU ?Renal disease  ?negative genitourinary ?  ?Musculoskeletal ? ? Abdominal ?  ?Peds ? Hematology ? ?(+) Blood dyscrasia, anemia ,   ?Anesthesia Other Findings ?Past Medical History: ?No date: Gout ?No date: Hyperlipidemia ?No date: Hypertension ?No date: Thyroid disease ?Past Surgical History: ?No date: APPENDECTOMY ?No date: CARDIAC SURGERY ?    Comment:  quad. bypass ?No date: peptic ulcer ?    Comment:  removed ?No date: TONSILLECTOMY ?BMI   ? Body Mass Index: 25.86 kg/m?  ?  ? Reproductive/Obstetrics ?negative OB ROS ? ?  ? ? ? ? ? ? ? ? ? ? ? ? ? ?  ?  ? ? ? ? ? ? ? ? ?Anesthesia Physical ?Anesthesia Plan ? ?ASA: 4 ? ?Anesthesia Plan: General  ? ?Post-op Pain Management:   ? ?Induction:  ? ?PONV Risk Score and Plan:  ? ?Airway Management Planned:  ? ?Additional Equipment:  ? ?Intra-op Plan:  ? ?Post-operative Plan:  ? ?Informed Consent: I have reviewed the patients History and Physical, chart, labs and discussed the procedure including the risks, benefits and alternatives for the proposed anesthesia with the patient or authorized representative who has indicated his/her understanding and acceptance.  ? ? ? ?Dental Advisory Given ? ?Plan Discussed with:  CRNA ? ?Anesthesia Plan Comments:   ? ? ? ? ? ? ?Anesthesia Quick Evaluation ? ?

## 2021-06-19 NOTE — Anesthesia Postprocedure Evaluation (Signed)
Anesthesia Post Note ? ?Patient: [name] ? ?Procedure(s) Performed: CARDIOVERSION ? ?Patient location during evaluation: PACU ?Anesthesia Type: General ?Level of consciousness: awake and alert ?Pain management: pain level controlled ?Vital Signs Assessment: post-procedure vital signs reviewed and stable ?Respiratory status: spontaneous breathing, nonlabored ventilation, respiratory function stable and patient connected to nasal cannula oxygen ?Cardiovascular status: blood pressure returned to baseline and stable ?Postop Assessment: no apparent nausea or vomiting ?Anesthetic complications: no ? ? ?No notable events documented. ? ? ?Last Vitals:  ?Vitals:  ? 06/14/21 0800 06/14/21 0815  ?BP: (!) 123/94 127/85  ?Pulse: (!) 123 81  ?Resp: 15 (!) 23  ?Temp:    ?SpO2: 97% 98%  ?  ?Last Pain:  ?Vitals:  ? 06/14/21 0815  ?TempSrc:   ?PainSc: 0-No pain  ? ? ?  ?  ?  ?  ?  ?  ? ?Yevette Edwards ? ? ? ? ?

## 2021-06-27 DIAGNOSIS — I25810 Atherosclerosis of coronary artery bypass graft(s) without angina pectoris: Secondary | ICD-10-CM | POA: Diagnosis not present

## 2021-06-27 DIAGNOSIS — I10 Essential (primary) hypertension: Secondary | ICD-10-CM | POA: Diagnosis not present

## 2021-06-27 DIAGNOSIS — I480 Paroxysmal atrial fibrillation: Secondary | ICD-10-CM | POA: Diagnosis not present

## 2021-06-27 DIAGNOSIS — I4892 Unspecified atrial flutter: Secondary | ICD-10-CM | POA: Diagnosis not present

## 2021-07-27 ENCOUNTER — Ambulatory Visit (INDEPENDENT_AMBULATORY_CARE_PROVIDER_SITE_OTHER): Payer: Medicare Other | Admitting: Family Medicine

## 2021-07-27 ENCOUNTER — Encounter: Payer: Self-pay | Admitting: Family Medicine

## 2021-07-27 VITALS — BP 130/82 | HR 76 | Ht 73.0 in | Wt 193.0 lb

## 2021-07-27 DIAGNOSIS — R739 Hyperglycemia, unspecified: Secondary | ICD-10-CM

## 2021-07-27 DIAGNOSIS — E039 Hypothyroidism, unspecified: Secondary | ICD-10-CM

## 2021-07-27 DIAGNOSIS — M1A9XX0 Chronic gout, unspecified, without tophus (tophi): Secondary | ICD-10-CM | POA: Diagnosis not present

## 2021-07-27 DIAGNOSIS — I480 Paroxysmal atrial fibrillation: Secondary | ICD-10-CM | POA: Diagnosis not present

## 2021-07-27 DIAGNOSIS — E7849 Other hyperlipidemia: Secondary | ICD-10-CM | POA: Diagnosis not present

## 2021-07-27 DIAGNOSIS — I10 Essential (primary) hypertension: Secondary | ICD-10-CM | POA: Diagnosis not present

## 2021-07-27 MED ORDER — OLMESARTAN MEDOXOMIL-HCTZ 40-25 MG PO TABS: 1.0000 | ORAL_TABLET | Freq: Every day | ORAL | 1 refills | Status: DC

## 2021-07-27 MED ORDER — ALLOPURINOL 100 MG PO TABS: 100.0000 mg | ORAL_TABLET | Freq: Every day | ORAL | 1 refills | Status: DC

## 2021-07-27 MED ORDER — LOVASTATIN 20 MG PO TABS: 20.0000 mg | ORAL_TABLET | Freq: Every day | ORAL | 1 refills | Status: DC

## 2021-07-27 MED ORDER — LEVOTHYROXINE SODIUM 100 MCG PO TABS: 100.0000 ug | ORAL_TABLET | Freq: Every day | ORAL | 1 refills | Status: DC

## 2021-07-27 NOTE — Progress Notes (Signed)
Date:  07/27/2021   Name:  [name]   DOB:  1945/11/28   MRN:  [id number]   Chief Complaint: Hypertension, Hyperlipidemia, Hypothyroidism, Gout, and Hyperglycemia  Hypertension This is a chronic problem. The current episode started more than 1 year ago. The problem has been gradually improving since onset. The problem is controlled. Pertinent negatives include no chest pain, palpitations or shortness of breath. Risk factors for coronary artery disease include dyslipidemia and diabetes mellitus. Past treatments include angiotensin blockers and diuretics. The current treatment provides moderate improvement. There are no compliance problems.  There is no history of angina, kidney disease, CAD/MI, CVA, heart failure, left ventricular hypertrophy, PVD or retinopathy. Identifiable causes of hypertension include a thyroid problem. There is no history of chronic renal disease, a hypertension causing med or renovascular disease.  Hyperlipidemia This is a chronic problem. The current episode started more than 1 year ago. The problem is controlled. Recent lipid tests were reviewed and are normal. Exacerbating diseases include hypothyroidism. He has no history of chronic renal disease. Factors aggravating his hyperlipidemia include thiazides. Pertinent negatives include no chest pain or shortness of breath. Current antihyperlipidemic treatment includes statins. The current treatment provides moderate improvement of lipids.  Hyperglycemia This is a new problem. The problem occurs intermittently. Pertinent negatives include no chest pain or fatigue.  Thyroid Problem Presents for follow-up visit. Patient reports no anxiety, cold intolerance, constipation, depressed mood, diarrhea, dry skin, fatigue, hair loss, heat intolerance, nail problem, palpitations, weight gain or weight loss. His past medical history is significant for hyperlipidemia. There is no history of heart failure.    Lab Results  Component  Value Date   NA 140 01/26/2021   K 4.6 01/26/2021   CO2 21 01/26/2021   GLUCOSE 81 01/26/2021   BUN 35 (H) 01/26/2021   CREATININE 2.23 (H) 01/26/2021   CALCIUM 9.6 01/26/2021   EGFR 30 (L) 01/26/2021   GFRNONAA 32 (L) 01/26/2020   Lab Results  Component Value Date   CHOL 119 07/27/2020   HDL 33 (L) 07/27/2020   LDLCALC 61 07/27/2020   TRIG 140 07/27/2020   CHOLHDL 4.0 07/03/2017   Lab Results  Component Value Date   TSH 2.770 07/27/2020   No results found for: "HGBA1C" Lab Results  Component Value Date   WBC 7.6 07/24/2018   HGB 14.0 07/24/2018   HCT 43.7 07/24/2018   MCV 85 07/24/2018   PLT 212 07/24/2018   Lab Results  Component Value Date   ALT 12 07/27/2020   AST 17 07/27/2020   ALKPHOS 74 07/27/2020   BILITOT 0.7 07/27/2020   No results found for: "25OHVITD2", "25OHVITD3", "VD25OH"   Review of Systems  Constitutional:  Negative for fatigue, weight gain and weight loss.  Respiratory:  Negative for shortness of breath.   Cardiovascular:  Negative for chest pain and palpitations.  Gastrointestinal:  Negative for constipation and diarrhea.  Endocrine: Negative for cold intolerance and heat intolerance.  Psychiatric/Behavioral:  The patient is not nervous/anxious.     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 bypass   CARDIOVERSION N/A 06/14/2021   Procedure: CARDIOVERSION;  Surgeon: Corey Skains, MD;  Location: [facility] ORS;  Service: Cardiovascular;  Laterality: N/A;   peptic ulcer     removed   TONSILLECTOMY      Social History   Tobacco Use   Smoking status: Never   Smokeless tobacco: Current    Types: Chew  Vaping Use   Vaping Use: Never used  Substance Use Topics   Alcohol use: Yes    Alcohol/week: 0.0 standard drinks of alcohol   Drug use: Never     Medication list has been reviewed and updated.  Current Meds  Medication Sig   allopurinol (ZYLOPRIM) 100 MG tablet Take 1 tablet (100 mg total) by mouth daily.   aspirin EC 81 MG tablet Take 81 mg by mouth every evening. Swallow whole.   diltiazem (CARDIZEM CD) 120 MG 24 hr capsule Take 120 mg by mouth in the morning.   levothyroxine (SYNTHROID) 100 MCG tablet Take 1 tablet (100 mcg total) by mouth daily.   lovastatin (MEVACOR) 20 MG tablet Take 1 tablet (20 mg total) by mouth daily. (Patient taking differently: Take 20 mg by mouth every evening.)   olmesartan-hydrochlorothiazide (BENICAR HCT) 40-25 MG tablet Take 1 tablet by mouth daily.   [DISCONTINUED] gabapentin (NEURONTIN) 100 MG capsule Take 1 capsule (100 mg total) by mouth daily.       07/27/2021    7:55 AM 02/03/2021   11:00 AM 01/26/2021    9:06 AM 12/01/2020   10:50 AM  GAD 7 : Generalized Anxiety Score  Nervous, Anxious, on Edge 0 0 0 0  Control/stop worrying 0 0 0 0  Worry too much - different things 0 0 0 0  Trouble relaxing 0 0 0 0  Restless 0 0 0 0  Easily annoyed or irritable 0 0 0 0  Afraid - awful might happen 0 0 0 0  Total GAD 7 Score 0 0 0 0  Anxiety Difficulty Not difficult at all Not difficult at all  Not difficult at all       07/27/2021     7:54 AM  Depression screen PHQ 2/9  Decreased Interest 0  Down, Depressed, Hopeless 0  PHQ - 2 Score 0  Altered sleeping 0  Tired, decreased energy 0  Change in appetite 0  Feeling bad or failure about yourself  0  Trouble concentrating 0  Moving slowly or fidgety/restless 0  Suicidal thoughts 0  PHQ-9 Score 0  Difficult doing work/chores Not difficult at all    BP Readings from Last 3 Encounters:  07/27/21 130/82  06/14/21 127/85  02/03/21 102/72    Physical Exam Vitals and nursing note reviewed.  HENT:     Head: Normocephalic.     Right Ear: External ear normal.     Left Ear: External ear normal.     Nose: Nose normal.  Eyes:     General: No scleral icterus.       Right eye: No discharge.        Left eye: No  discharge.     Conjunctiva/sclera: Conjunctivae normal.     Pupils: Pupils are equal, round, and reactive to light.  Neck:     Thyroid: No thyromegaly.     Vascular: No JVD.     Trachea: No tracheal deviation.  Cardiovascular:     Rate and Rhythm: Normal rate and regular rhythm.     Heart sounds: Normal heart sounds. No murmur heard.    No friction rub. No gallop.  Pulmonary:     Effort: No respiratory distress.     Breath sounds: Normal breath sounds. No wheezing or rales.  Abdominal:     General: Bowel sounds are normal.     Palpations: Abdomen is soft. There is no mass.     Tenderness: There is no abdominal tenderness. There is no guarding or rebound.  Musculoskeletal:        General: No tenderness. Normal range of motion.     Cervical back: Normal range of motion and neck supple.  Lymphadenopathy:     Cervical: No cervical adenopathy.  Skin:    General: Skin is warm.     Findings: No rash.  Neurological:     Mental Status: He is alert and oriented to person, place, and time.     Cranial Nerves: No cranial nerve deficit.     Deep Tendon Reflexes: Reflexes are normal and symmetric.     Wt Readings from Last 3 Encounters:  07/27/21 193 lb (87.5  kg)  06/14/21 196 lb (88.9 kg)  02/03/21 199 lb (90.3 kg)    BP 130/82   Pulse 76   Ht 6' 1" (1.854 m)   Wt 193 lb (87.5 kg)   BMI 25.46 kg/m   Assessment and Plan:  1. Chronic gout without tophus, unspecified cause, unspecified site Chronic.  History of hyper uric acidemia.  Patient is currently on allopurinol 100 mg once a day and we will check current uric acid level for control. - allopurinol (ZYLOPRIM) 100 MG tablet; Take 1 tablet (100 mg total) by mouth daily.  Dispense: 90 tablet; Refill: 1 - Uric acid  2. Paroxysmal atrial fibrillation (HCC) Followed by cardiology with recent attempt at cardioversion.  Patient will continue to be followed by rate control and he has since discontinued Xarelto. - levothyroxine (SYNTHROID) 100 MCG tablet; Take 1 tablet (100 mcg total) by mouth daily.  Dispense: 90 tablet; Refill: 1  3. Hypothyroidism, unspecified type Chronic.  Controlled.  Stable.  Will check thyroid function panel pending results will likely continue 100 mcg daily. - levothyroxine (SYNTHROID) 100 MCG tablet; Take 1 tablet (100 mcg total) by mouth daily.  Dispense: 90 tablet; Refill: 1 - Thyroid Panel With TSH  4. Familial multiple lipoprotein-type hyperlipidemia Chronic.  Controlled.  Stable.  Continue lovastatin 20 mg once a day.  We will check lipid panel. - lovastatin (MEVACOR) 20 MG tablet; Take 1 tablet (20 mg total) by mouth daily.  Dispense: 90 tablet; Refill: 1 - Lipid Panel With LDL/HDL Ratio  5. Essential hypertension Chronic.  Controlled.  Stable.  Blood pressure 130/82.  Continue olmesartan hydrochlorothiazide 40-25 1 a day. - olmesartan-hydrochlorothiazide (BENICAR HCT) 40-25 MG tablet; Take 1 tablet by mouth daily.  Dispense: 90 tablet; Refill: 1  6. Hyperglycemia Patient with mild elevation of hyper glycemia and we will check A1c the evaluate for possible prediabetes. - HgB A1c    

## 2021-07-27 NOTE — Patient Instructions (Signed)

## 2021-07-28 LAB — LIPID PANEL WITH LDL/HDL RATIO
Cholesterol, Total: 108 mg/dL (ref 100–199)
HDL: 39 mg/dL — ABNORMAL LOW (ref >39)
LDL Chol Calc (NIH): 52 mg/dL (ref 0–99)
LDL/HDL Ratio: 1.3 ratio (ref 0.0–3.6)
Triglycerides: 88 mg/dL (ref 0–149)
VLDL Cholesterol Cal: 17 mg/dL (ref 5–40)

## 2021-07-28 LAB — HEMOGLOBIN A1C
Est. average glucose Bld gHb Est-mCnc: 137 mg/dL
Hgb A1c MFr Bld: 6.4 % — ABNORMAL HIGH (ref 4.8–5.6)

## 2021-07-28 LAB — THYROID PANEL WITH TSH
Free Thyroxine Index: 2.7 (ref 1.2–4.9)
T3 Uptake Ratio: 31 % (ref 24–39)
T4, Total: 8.6 ug/dL (ref 4.5–12.0)
TSH: 1.74 u[IU]/mL (ref 0.450–4.500)

## 2021-07-28 LAB — URIC ACID: Uric Acid: 6.8 mg/dL (ref 3.8–8.4)

## 2021-08-17 DIAGNOSIS — H2513 Age-related nuclear cataract, bilateral: Secondary | ICD-10-CM | POA: Diagnosis not present

## 2021-08-17 DIAGNOSIS — H31003 Unspecified chorioretinal scars, bilateral: Secondary | ICD-10-CM | POA: Diagnosis not present

## 2021-08-28 ENCOUNTER — Ambulatory Visit (INDEPENDENT_AMBULATORY_CARE_PROVIDER_SITE_OTHER): Payer: Medicare Other | Admitting: Family Medicine

## 2021-08-28 ENCOUNTER — Encounter: Payer: Self-pay | Admitting: Family Medicine

## 2021-08-28 VITALS — BP 120/70 | HR 78 | Temp 98.5°F | Ht 73.0 in | Wt 190.0 lb

## 2021-08-28 DIAGNOSIS — R051 Acute cough: Secondary | ICD-10-CM | POA: Diagnosis not present

## 2021-08-28 DIAGNOSIS — J0100 Acute maxillary sinusitis, unspecified: Secondary | ICD-10-CM

## 2021-08-28 MED ORDER — PROMETHAZINE-DM 6.25-15 MG/5ML PO SYRP: 5.0000 mL | ORAL_SOLUTION | Freq: Four times a day (QID) | ORAL | 0 refills | Status: DC | PRN

## 2021-08-28 MED ORDER — AMOXICILLIN-POT CLAVULANATE 875-125 MG PO TABS
1.0000 | ORAL_TABLET | Freq: Two times a day (BID) | ORAL | 0 refills | Status: DC
Start: 2021-08-28 — End: 2022-01-26

## 2021-08-28 NOTE — Progress Notes (Signed)
Date:  08/28/2021   Name:  [name]   DOB:  Nov 05, 1945   MRN:  [id number]   Chief Complaint: Sore Throat (Had scratchy throat, runny nose. Last 3 nights, can't lay down to sleep for coughing. Some production. Using "throat spray")  Cough This is a new problem. The current episode started in the past 7 days. The problem has been waxing and waning. The problem occurs every few minutes. The cough is Productive of sputum. Associated symptoms include nasal congestion, postnasal drip, rhinorrhea and a sore throat. Pertinent negatives include no chest pain, ear pain, headaches, hemoptysis, shortness of breath or wheezing.  Sinusitis This is a new problem. The current episode started in the past 7 days. The problem has been waxing and waning since onset. There has been no fever. Associated symptoms include congestion, coughing, sinus pressure and a sore throat. Pertinent negatives include no diaphoresis, ear pain, headaches or shortness of breath. Past treatments include nothing. The treatment provided mild relief.    Lab Results  Component Value Date   NA 140 01/26/2021   K 4.6 01/26/2021   CO2 21 01/26/2021   GLUCOSE 81 01/26/2021   BUN 35 (H) 01/26/2021   CREATININE 2.23 (H) 01/26/2021   CALCIUM 9.6 01/26/2021   EGFR 30 (L) 01/26/2021   GFRNONAA 32 (L) 01/26/2020   Lab Results  Component Value Date   CHOL 108 07/27/2021   HDL 39 (L) 07/27/2021   LDLCALC 52 07/27/2021   TRIG 88 07/27/2021   CHOLHDL 4.0 07/03/2017   Lab Results  Component Value Date   TSH 1.740 07/27/2021   Lab Results  Component Value Date   HGBA1C 6.4 (H) 07/27/2021   Lab Results  Component Value Date   WBC 7.6 07/24/2018   HGB 14.0 07/24/2018   HCT 43.7 07/24/2018   MCV 85 07/24/2018   PLT 212 07/24/2018   Lab Results  Component Value Date   ALT 12 07/27/2020   AST 17 07/27/2020   ALKPHOS 74 07/27/2020   BILITOT 0.7 07/27/2020   No results found for: "25OHVITD2", "25OHVITD3", "VD25OH"    Review of Systems  Constitutional:  Negative for diaphoresis.  HENT:  Positive for congestion, postnasal drip, rhinorrhea, sinus pressure and sore throat. Negative for ear pain.   Respiratory:  Positive for cough. Negative for hemoptysis, shortness of breath and wheezing.   Cardiovascular:  Negative for chest pain, palpitations and leg swelling.  Neurological:  Negative for headaches.    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 bypass   CARDIOVERSION N/A 06/14/2021   Procedure: CARDIOVERSION;  Surgeon: Lamar Blinks, MD;  Location: [facility] ORS;  Service: Cardiovascular;  Laterality: N/A;   peptic ulcer     removed   TONSILLECTOMY      Social History   Tobacco Use   Smoking status: Never   Smokeless tobacco: Current    Types: Chew  Vaping Use   Vaping Use: Never used  Substance Use Topics   Alcohol use: Yes    Alcohol/week: 0.0 standard drinks of alcohol   Drug use: Never     Medication list has been reviewed and updated.  Current Meds  Medication Sig   allopurinol (ZYLOPRIM) 100 MG tablet Take 1 tablet (100 mg total) by  mouth daily.   aspirin EC 81 MG tablet Take 81 mg by mouth every evening. Swallow whole.   diltiazem (CARDIZEM CD) 120 MG 24 hr capsule Take 120 mg by mouth in the morning.   levothyroxine (SYNTHROID) 100 MCG tablet Take 1 tablet (100 mcg total) by mouth daily.   lovastatin (MEVACOR) 20 MG tablet Take 1 tablet (20 mg total) by mouth daily.   olmesartan-hydrochlorothiazide (BENICAR HCT) 40-25 MG tablet Take 1 tablet by mouth daily.       07/27/2021    7:55 AM 02/03/2021   11:00 AM 01/26/2021    9:06 AM 12/01/2020   10:50 AM  GAD 7 : Generalized Anxiety Score  Nervous, Anxious, on Edge 0 0 0 0  Control/stop worrying 0 0 0 0  Worry too much - different things 0 0 0 0  Trouble relaxing 0 0 0 0  Restless 0 0 0 0  Easily annoyed or irritable 0 0 0 0  Afraid - awful might happen 0 0 0 0  Total GAD 7 Score 0 0 0 0  Anxiety Difficulty Not difficult at all Not difficult at all  Not difficult at all       07/27/2021    7:54 AM 02/03/2021   11:00 AM 01/26/2021    9:05 AM  Depression screen PHQ 2/9  Decreased Interest 0 0 0  Down, Depressed, Hopeless 0 0 0  PHQ - 2 Score 0 0 0  Altered sleeping 0 0 0  Tired, decreased energy 0 0 0  Change in appetite 0 0 0  Feeling bad or failure about yourself  0 0 0  Trouble concentrating 0 0 0  Moving slowly or fidgety/restless 0 0 0  Suicidal thoughts 0 0 0  PHQ-9 Score 0 0 0  Difficult doing work/chores Not difficult at all Not difficult at all     BP Readings from Last 3 Encounters:  08/28/21 120/70  07/27/21 130/82  06/14/21 127/85    Physical Exam Vitals and nursing note reviewed.  HENT:     Head: Normocephalic.     Right Ear: Tympanic membrane and ear canal normal. No drainage, swelling or tenderness. No middle ear effusion.     Left Ear: Tympanic membrane and ear canal normal. No drainage, swelling or tenderness.  No middle ear effusion.     Nose: No congestion or rhinorrhea.     Mouth/Throat:     Mouth: Mucous membranes are moist.  No oral lesions.     Pharynx: No pharyngeal swelling, oropharyngeal exudate or posterior oropharyngeal erythema.  Eyes:     Extraocular Movements:     Right eye: Normal extraocular motion.     Left eye: Normal extraocular motion.  Neck:     Thyroid: No thyromegaly.  Cardiovascular:     Heart sounds: No murmur heard.    No friction rub. No gallop.  Pulmonary:  Breath sounds: No decreased breath sounds, wheezing, rhonchi or rales.  Musculoskeletal:     Cervical back: Normal range of motion and neck supple.  Lymphadenopathy:     Cervical: No cervical adenopathy.  Skin:    General: Skin is warm.  Neurological:     Mental Status: He is alert.     Wt Readings from Last 3 Encounters:  08/28/21 190 lb (86.2 kg)  07/27/21 193 lb (87.5 kg)  06/14/21 196 lb (88.9 kg)    BP 120/70   Pulse 78   Temp 98.5 F (36.9 C) (Oral)   Ht $R'6\' 1"'MD$  (1.854 m)   Wt 190 lb (86.2 kg)   BMI 25.07 kg/m   Assessment and Plan:  1. Acute maxillary sinusitis, recurrence not specified Chronic.  Persistent.  Stable.  History is consistent with an acute maxillary sinusitis but examination notes no tenderness over the frontal or sinus aspects.  Patient may have involvement of the sphenoid and ethmoid and therefore we will initiate Augmentin 875 mg twice a day for 10 days.  2. Acute cough Chronic.  Persistent.  Primarily noted at night.  We will treat with promethazine dextromethorphan 1 teaspoon 4 times a day as needed for cough. - promethazine-dextromethorphan (PROMETHAZINE-DM) 6.25-15 MG/5ML syrup; Take 5 mLs by mouth 4 (four) times daily as needed for cough.  Dispense: 118 mL; Refill: 0

## 2021-09-04 DIAGNOSIS — R809 Proteinuria, unspecified: Secondary | ICD-10-CM | POA: Diagnosis not present

## 2021-09-04 DIAGNOSIS — N2581 Secondary hyperparathyroidism of renal origin: Secondary | ICD-10-CM | POA: Diagnosis not present

## 2021-09-04 DIAGNOSIS — D631 Anemia in chronic kidney disease: Secondary | ICD-10-CM | POA: Diagnosis not present

## 2021-09-04 DIAGNOSIS — N1832 Chronic kidney disease, stage 3b: Secondary | ICD-10-CM | POA: Diagnosis not present

## 2021-09-04 DIAGNOSIS — I129 Hypertensive chronic kidney disease with stage 1 through stage 4 chronic kidney disease, or unspecified chronic kidney disease: Secondary | ICD-10-CM | POA: Diagnosis not present

## 2021-09-05 DIAGNOSIS — I25810 Atherosclerosis of coronary artery bypass graft(s) without angina pectoris: Secondary | ICD-10-CM | POA: Diagnosis not present

## 2021-09-05 DIAGNOSIS — I4892 Unspecified atrial flutter: Secondary | ICD-10-CM | POA: Diagnosis not present

## 2021-09-05 DIAGNOSIS — E782 Mixed hyperlipidemia: Secondary | ICD-10-CM | POA: Diagnosis not present

## 2021-09-05 DIAGNOSIS — I10 Essential (primary) hypertension: Secondary | ICD-10-CM | POA: Diagnosis not present

## 2021-09-05 DIAGNOSIS — I480 Paroxysmal atrial fibrillation: Secondary | ICD-10-CM | POA: Diagnosis not present

## 2021-09-06 ENCOUNTER — Other Ambulatory Visit: Payer: Self-pay | Admitting: Family Medicine

## 2021-09-06 DIAGNOSIS — R051 Acute cough: Secondary | ICD-10-CM

## 2021-09-06 NOTE — Telephone Encounter (Signed)
Medication Refill - Medication: promethazine-dextromethorphan (PROMETHAZINE-DM) 6.25-15 MG/5ML syrup / pt needs a refill    Has the patient contacted their pharmacy? No. (Agent: If no, request that the patient contact the pharmacy for the refill. If patient does not wish to contact the pharmacy document the reason why and proceed with request.) (Agent: If yes, when and what did the pharmacy advise?)  Preferred Pharmacy (with phone number or street name): [facility] [phone], [place] - [address]  [address] [place], [place] [address]  Phone:  [phone]  Fax:  [phone]   Has the patient been seen for an appointment in the last year OR does the patient have an upcoming appointment? Yes.    Agent: Please be advised that RX refills may take up to 3 business days. We ask that you follow-up with your pharmacy.

## 2021-09-07 MED ORDER — PROMETHAZINE-DM 6.25-15 MG/5ML PO SYRP: 5.0000 mL | ORAL_SOLUTION | Freq: Four times a day (QID) | ORAL | 0 refills | Status: DC | PRN

## 2021-09-07 NOTE — Telephone Encounter (Signed)
Requested Prescriptions  Pending Prescriptions Disp Refills  . promethazine-dextromethorphan (PROMETHAZINE-DM) 6.25-15 MG/5ML syrup 118 mL 0    Sig: Take 5 mLs by mouth 4 (four) times daily as needed for cough.     Ear, Nose, and Throat:  Antitussives/Expectorants Passed - 09/06/2021 11:27 AM      Passed - Valid encounter within last 12 months    Recent Outpatient Visits          1 week ago Acute maxillary sinusitis, recurrence not specified   [facility] Duanne Limerick, MD   1 month ago Essential hypertension   [facility] Duanne Limerick, MD   7 months ago Median nerve dysfunction, right   [facility] Jerrol Banana, MD   7 months ago Essential hypertension   [facility] Duanne Limerick, MD   9 months ago Carpal tunnel syndrome on right   [facility] Jerrol Banana, MD      Future Appointments            In 4 months Duanne Limerick, MD [facility], [facility]

## 2021-09-24 ENCOUNTER — Other Ambulatory Visit: Payer: Self-pay | Admitting: Family Medicine

## 2022-01-26 ENCOUNTER — Encounter: Payer: Self-pay | Admitting: Family Medicine

## 2022-01-26 ENCOUNTER — Ambulatory Visit (INDEPENDENT_AMBULATORY_CARE_PROVIDER_SITE_OTHER): Payer: Medicare Other | Admitting: Family Medicine

## 2022-01-26 VITALS — BP 128/78 | HR 100 | Ht 73.0 in | Wt 194.0 lb

## 2022-01-26 DIAGNOSIS — I10 Essential (primary) hypertension: Secondary | ICD-10-CM

## 2022-01-26 DIAGNOSIS — E039 Hypothyroidism, unspecified: Secondary | ICD-10-CM

## 2022-01-26 DIAGNOSIS — E7849 Other hyperlipidemia: Secondary | ICD-10-CM | POA: Diagnosis not present

## 2022-01-26 DIAGNOSIS — M1A9XX0 Chronic gout, unspecified, without tophus (tophi): Secondary | ICD-10-CM | POA: Diagnosis not present

## 2022-01-26 DIAGNOSIS — R7303 Prediabetes: Secondary | ICD-10-CM

## 2022-01-26 DIAGNOSIS — I480 Paroxysmal atrial fibrillation: Secondary | ICD-10-CM | POA: Diagnosis not present

## 2022-01-26 MED ORDER — ALLOPURINOL 100 MG PO TABS: 100.0000 mg | ORAL_TABLET | Freq: Every day | ORAL | 1 refills | Status: DC

## 2022-01-26 MED ORDER — OLMESARTAN MEDOXOMIL-HCTZ 40-25 MG PO TABS: 1.0000 | ORAL_TABLET | Freq: Every day | ORAL | 1 refills | Status: DC

## 2022-01-26 MED ORDER — LEVOTHYROXINE SODIUM 100 MCG PO TABS: 100.0000 ug | ORAL_TABLET | Freq: Every day | ORAL | 1 refills | Status: DC

## 2022-01-26 MED ORDER — LOVASTATIN 20 MG PO TABS: 20.0000 mg | ORAL_TABLET | Freq: Every day | ORAL | 1 refills | Status: DC

## 2022-01-26 NOTE — Progress Notes (Signed)
Date:  01/26/2022   Name:  [name]   DOB:  Aug 23, 1945   MRN:  [id number]   Chief Complaint: Hypothyroidism, Gout, Hypertension, and Hyperlipidemia  Hypertension This is a chronic problem. The current episode started more than 1 year ago. The problem has been waxing and waning since onset. The problem is controlled. Pertinent negatives include no anxiety, blurred vision, chest pain, headaches, malaise/fatigue, neck pain, orthopnea, palpitations, peripheral edema, PND, shortness of breath or sweats. There are no associated agents to hypertension. Past treatments include angiotensin blockers and diuretics. The current treatment provides moderate improvement. There is no history of angina, kidney disease, CAD/MI, CVA, heart failure, left ventricular hypertrophy, PVD or retinopathy. Identifiable causes of hypertension include a thyroid problem. There is no history of chronic renal disease, a hypertension causing med or renovascular disease.  Hyperlipidemia This is a chronic problem. The current episode started more than 1 year ago. The problem is controlled. Recent lipid tests were reviewed and are normal. He has no history of chronic renal disease. There are no known factors aggravating his hyperlipidemia. Pertinent negatives include no chest pain, myalgias or shortness of breath. The current treatment provides moderate improvement of lipids.  Thyroid Problem Presents for follow-up visit. Symptoms include nail problem. Patient reports no anxiety, cold intolerance, constipation, diarrhea, dry skin, hair loss, heat intolerance, palpitations, weight gain or weight loss. The symptoms have been stable. His past medical history is significant for hyperlipidemia. There is no history of heart failure.    Lab Results  Component Value Date   NA 140 01/26/2021   K 4.6 01/26/2021   CO2 21 01/26/2021   GLUCOSE 81 01/26/2021   BUN 35 (H) 01/26/2021   CREATININE 2.23 (H) 01/26/2021   CALCIUM 9.6  01/26/2021   EGFR 30 (L) 01/26/2021   GFRNONAA 32 (L) 01/26/2020   Lab Results  Component Value Date   CHOL 108 07/27/2021   HDL 39 (L) 07/27/2021   LDLCALC 52 07/27/2021   TRIG 88 07/27/2021   CHOLHDL 4.0 07/03/2017   Lab Results  Component Value Date   TSH 1.740 07/27/2021   Lab Results  Component Value Date   HGBA1C 6.4 (H) 07/27/2021   Lab Results  Component Value Date   WBC 7.6 07/24/2018   HGB 14.0 07/24/2018   HCT 43.7 07/24/2018   MCV 85 07/24/2018   PLT 212 07/24/2018   Lab Results  Component Value Date   ALT 12 07/27/2020   AST 17 07/27/2020   ALKPHOS 74 07/27/2020   BILITOT 0.7 07/27/2020   No results found for: "25OHVITD2", "25OHVITD3", "VD25OH"   Review of Systems  Constitutional:  Negative for chills, fever, malaise/fatigue, weight gain and weight loss.  HENT:  Negative for drooling, ear discharge, ear pain and sore throat.   Eyes:  Negative for blurred vision.  Respiratory:  Negative for cough, shortness of breath and wheezing.   Cardiovascular:  Negative for chest pain, palpitations, orthopnea, leg swelling and PND.  Gastrointestinal:  Negative for abdominal pain, blood in stool, constipation, diarrhea and nausea.  Endocrine: Negative for cold intolerance, heat intolerance and polydipsia.  Genitourinary:  Negative for dysuria, frequency, hematuria and urgency.  Musculoskeletal:  Negative for back pain, myalgias and neck pain.  Skin:  Negative for rash.  Allergic/Immunologic: Negative for environmental allergies.  Neurological:  Negative for dizziness and headaches.  Hematological:  Does not bruise/bleed easily.  Psychiatric/Behavioral:  Negative for suicidal ideas. The patient is not nervous/anxious.  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 bypass   CARDIOVERSION N/A 06/14/2021   Procedure: CARDIOVERSION;  Surgeon: Corey Skains, MD;  Location: [facility] ORS;  Service: Cardiovascular;  Laterality: N/A;   peptic ulcer     removed   TONSILLECTOMY      Social History   Tobacco Use   Smoking status: Never   Smokeless tobacco: Current    Types: Chew  Vaping Use   Vaping Use: Never used  Substance Use Topics   Alcohol use: Yes    Alcohol/week: 0.0 standard drinks of alcohol   Drug use: Never     Medication list has been reviewed and updated.  Current Meds  Medication Sig   allopurinol (ZYLOPRIM) 100 MG tablet Take 1 tablet (100 mg total) by mouth daily.   aspirin EC 81 MG tablet Take 81 mg by mouth every evening. Swallow whole.   diltiazem (CARDIZEM CD) 120 MG 24 hr capsule Take 120 mg by mouth in the morning.   levothyroxine (SYNTHROID) 100 MCG tablet Take 1 tablet (100 mcg total) by mouth daily.   lovastatin (MEVACOR) 20 MG tablet Take 1 tablet (20 mg total) by mouth daily.   metoprolol succinate (TOPROL-XL) 25 MG 24 hr tablet Take 50 mg by mouth daily.   olmesartan-hydrochlorothiazide (BENICAR HCT) 40-25 MG tablet Take 1 tablet by mouth daily.       01/26/2022    9:02 AM 07/27/2021    7:55 AM 02/03/2021   11:00 AM 01/26/2021    9:06 AM  GAD 7 : Generalized Anxiety  Score  Nervous, Anxious, on Edge 0 0 0 0  Control/stop worrying 0 0 0 0  Worry too much - different things 0 0 0 0  Trouble relaxing 0 0 0 0  Restless 0 0 0 0  Easily annoyed or irritable 0 0 0 0  Afraid - awful might happen 0 0 0 0  Total GAD 7 Score 0 0 0 0  Anxiety Difficulty Not difficult at all Not difficult at all Not difficult at all        01/26/2022    9:02 AM 07/27/2021    7:54 AM 02/03/2021   11:00 AM  Depression screen PHQ 2/9  Decreased Interest 0 0 0  Down, Depressed, Hopeless 0 0 0  PHQ - 2 Score 0 0 0  Altered sleeping 0 0 0  Tired, decreased energy 0 0 0  Change in appetite 0 0 0  Feeling bad or failure about yourself  0 0 0  Trouble concentrating 0 0 0  Moving slowly or fidgety/restless 0 0 0  Suicidal thoughts 0 0 0  PHQ-9 Score  0 0 0  Difficult doing work/chores Not difficult at all Not difficult at all Not difficult at all    BP Readings from Last 3 Encounters:  01/26/22 128/78  08/28/21 120/70  07/27/21 130/82    Physical Exam Vitals and nursing note reviewed.  HENT:     Head: Normocephalic.     Right Ear: External ear normal.     Left Ear: External ear normal.     Nose: Nose normal.  Eyes:     General: No scleral icterus.       Right eye: No discharge.        Left eye: No discharge.     Conjunctiva/sclera: Conjunctivae normal.     Pupils: Pupils are equal, round, and reactive to light.  Neck:     Thyroid: No thyromegaly.     Vascular: No JVD.     Trachea: No tracheal deviation.  Cardiovascular:     Rate and Rhythm: Normal rate and regular rhythm.     Heart sounds: Normal heart sounds and S1 normal. No murmur heard.    No systolic murmur is present.     No diastolic murmur is present.     No friction rub. No gallop. No S3 or S4 sounds.  Pulmonary:     Effort: No respiratory distress.     Breath sounds: Normal breath sounds. No wheezing or rales.  Abdominal:     General: Bowel sounds are normal.     Palpations: Abdomen is soft. There  is no mass.     Tenderness: There is no abdominal tenderness. There is no guarding or rebound.  Musculoskeletal:        General: No tenderness. Normal range of motion.     Cervical back: Normal range of motion and neck supple.  Lymphadenopathy:     Cervical: No cervical adenopathy.  Skin:    General: Skin is warm.     Capillary Refill: Capillary refill takes less than 2 seconds.     Findings: Lesion present. No rash.     Comments: ?insect bite  Neurological:     Mental Status: He is alert and oriented to person, place, and time.     Cranial Nerves: No cranial nerve deficit.     Deep Tendon Reflexes: Reflexes are normal and symmetric.     Wt Readings from Last 3 Encounters:  01/26/22 194 lb (88 kg)  08/28/21 190 lb (86.2 kg)  07/27/21 193 lb (87.5 kg)    BP 128/78   Pulse 100   Ht _0  (1.854 m)   Wt 194 lb (88 kg)   SpO2 98%   BMI 25.60 kg/m   Assessment and Plan:  1. Essential hypertension Chronic.  Controlled.  Stable.  Blood pressure 128/78.  Continue olmesartan hydrochlorothiazide 40-25 mg once a day.  Will check renal function panel for current electrolytes and GFR.  Will recheck in 6 months. - olmesartan-hydrochlorothiazide (BENICAR HCT) 40-25 MG tablet; Take 1 tablet by mouth daily.  Dispense: 90 tablet; Refill: 1 - Renal Function Panel  2. Hypothyroidism, unspecified type Chronic.  Controlled.  Stable.  Patient is asymptomatic hypothyroid.  Will check TSH in 6 months in the meantime continue levothyroxine 100 mcg daily. - levothyroxine (SYNTHROID) 100 MCG tablet; Take 1 tablet (100 mcg total) by mouth daily.  Dispense: 90 tablet; Refill: 1  3. Prediabetes .  Controlled.  Continue with diet control of limiting concentrated sweets and carbohydrates and we will check A1c for current level of control  with dietary approach. - HgB A1c  4. Familial multiple lipoprotein-type hyperlipidemia .  Controlled.  Stable.  Continue lovastatin 20 mg once a day.  Continue  lovastatin 20 mg once a day.  Will check lipid panel for lower level of control. - lovastatin (MEVACOR) 20 MG tablet; Take 1 tablet (20 mg total) by mouth daily.  Dispense: 90 tablet; Refill: 1 - Lipid Panel With LDL/HDL Ratio  5. Paroxysmal atrial fibrillation ([place]) Followed by cardiology currently is not on anticoagulation and has foregone ablation.  Patient on auscultation and palpitation of the pulse rate is in a regular rhythm. - levothyroxine (SYNTHROID) 100 MCG tablet; Take 1 tablet (100 mcg total) by mouth daily.  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  Controlled.  Stable.  Continue allopurinol 100 mg once a day. - allopurinol (ZYLOPRIM) 100 MG tablet; Take 1 tablet (100 mg total) by mouth daily.  Dispense: 90 tablet; Refill: 1    Otilio Miu, MD

## 2022-01-26 NOTE — Patient Instructions (Signed)

## 2022-01-27 LAB — RENAL FUNCTION PANEL
Albumin: 4.3 g/dL (ref 3.8–4.8)
BUN/Creatinine Ratio: 20 (ref 10–24)
BUN: 42 mg/dL — ABNORMAL HIGH (ref 8–27)
CO2: 21 mmol/L (ref 20–29)
Calcium: 9.4 mg/dL (ref 8.6–10.2)
Chloride: 103 mmol/L (ref 96–106)
Creatinine, Ser: 2.1 mg/dL — ABNORMAL HIGH (ref 0.76–1.27)
Glucose: 113 mg/dL — ABNORMAL HIGH (ref 70–99)
Phosphorus: 4 mg/dL (ref 2.8–4.1)
Potassium: 4.8 mmol/L (ref 3.5–5.2)
Sodium: 138 mmol/L (ref 134–144)
eGFR: 32 mL/min/{1.73_m2} — ABNORMAL LOW (ref >59)

## 2022-01-27 LAB — LIPID PANEL WITH LDL/HDL RATIO
Cholesterol, Total: 113 mg/dL (ref 100–199)
HDL: 37 mg/dL — ABNORMAL LOW (ref >39)
LDL Chol Calc (NIH): 56 mg/dL (ref 0–99)
LDL/HDL Ratio: 1.5 ratio (ref 0.0–3.6)
Triglycerides: 108 mg/dL (ref 0–149)
VLDL Cholesterol Cal: 20 mg/dL (ref 5–40)

## 2022-01-27 LAB — HEMOGLOBIN A1C
Est. average glucose Bld gHb Est-mCnc: 140 mg/dL
Hgb A1c MFr Bld: 6.5 % — ABNORMAL HIGH (ref 4.8–5.6)

## 2022-03-05 DIAGNOSIS — I129 Hypertensive chronic kidney disease with stage 1 through stage 4 chronic kidney disease, or unspecified chronic kidney disease: Secondary | ICD-10-CM | POA: Diagnosis not present

## 2022-03-05 DIAGNOSIS — R809 Proteinuria, unspecified: Secondary | ICD-10-CM | POA: Diagnosis not present

## 2022-03-05 DIAGNOSIS — N2581 Secondary hyperparathyroidism of renal origin: Secondary | ICD-10-CM | POA: Diagnosis not present

## 2022-03-05 DIAGNOSIS — N1832 Chronic kidney disease, stage 3b: Secondary | ICD-10-CM | POA: Diagnosis not present

## 2022-03-05 DIAGNOSIS — I10 Essential (primary) hypertension: Secondary | ICD-10-CM | POA: Diagnosis not present

## 2022-03-13 DIAGNOSIS — R809 Proteinuria, unspecified: Secondary | ICD-10-CM | POA: Diagnosis not present

## 2022-03-13 DIAGNOSIS — N1832 Chronic kidney disease, stage 3b: Secondary | ICD-10-CM | POA: Diagnosis not present

## 2022-03-13 DIAGNOSIS — N2581 Secondary hyperparathyroidism of renal origin: Secondary | ICD-10-CM | POA: Diagnosis not present

## 2022-03-13 DIAGNOSIS — I129 Hypertensive chronic kidney disease with stage 1 through stage 4 chronic kidney disease, or unspecified chronic kidney disease: Secondary | ICD-10-CM | POA: Diagnosis not present

## 2022-03-13 DIAGNOSIS — D631 Anemia in chronic kidney disease: Secondary | ICD-10-CM | POA: Diagnosis not present

## 2022-05-03 ENCOUNTER — Telehealth: Payer: Self-pay

## 2022-05-03 NOTE — Telephone Encounter (Signed)
Error

## 2022-05-03 NOTE — Telephone Encounter (Signed)
Called pt to schedule AWV. Pt declined.  KP

## 2022-07-30 ENCOUNTER — Ambulatory Visit (INDEPENDENT_AMBULATORY_CARE_PROVIDER_SITE_OTHER): Payer: Medicare Other | Admitting: Family Medicine

## 2022-07-30 ENCOUNTER — Other Ambulatory Visit
Admission: RE | Admit: 2022-07-30 | Discharge: 2022-07-30 | Disposition: A | Discharge status: Home / Self Care | Payer: Medicare Other | Attending: Family Medicine | Admitting: Family Medicine

## 2022-07-30 ENCOUNTER — Encounter: Payer: Self-pay | Admitting: Family Medicine

## 2022-07-30 VITALS — BP 128/78 | HR 117 | Ht 73.0 in | Wt 193.0 lb

## 2022-07-30 DIAGNOSIS — Z23 Encounter for immunization: Secondary | ICD-10-CM | POA: Diagnosis not present

## 2022-07-30 DIAGNOSIS — M1A9XX0 Chronic gout, unspecified, without tophus (tophi): Secondary | ICD-10-CM | POA: Diagnosis not present

## 2022-07-30 DIAGNOSIS — I10 Essential (primary) hypertension: Secondary | ICD-10-CM | POA: Diagnosis not present

## 2022-07-30 DIAGNOSIS — E039 Hypothyroidism, unspecified: Secondary | ICD-10-CM

## 2022-07-30 DIAGNOSIS — I480 Paroxysmal atrial fibrillation: Secondary | ICD-10-CM

## 2022-07-30 DIAGNOSIS — R7303 Prediabetes: Secondary | ICD-10-CM | POA: Diagnosis not present

## 2022-07-30 DIAGNOSIS — E7849 Other hyperlipidemia: Secondary | ICD-10-CM | POA: Diagnosis not present

## 2022-07-30 LAB — COMPREHENSIVE METABOLIC PANEL
ALT: 14 U/L (ref 0–44)
AST: 22 U/L (ref 15–41)
Albumin: 4.1 g/dL (ref 3.5–5.0)
Alkaline Phosphatase: 82 U/L (ref 38–126)
Anion gap: 9 (ref 5–15)
BUN: 50 mg/dL — ABNORMAL HIGH (ref 8–23)
CO2: 23 mmol/L (ref 22–32)
Calcium: 9.4 mg/dL (ref 8.9–10.3)
Chloride: 103 mmol/L (ref 98–111)
Creatinine, Ser: 2.39 mg/dL — ABNORMAL HIGH (ref 0.61–1.24)
GFR, Estimated: 27 mL/min — ABNORMAL LOW (ref >60)
Glucose, Bld: 101 mg/dL — ABNORMAL HIGH (ref 70–99)
Potassium: 5 mmol/L (ref 3.5–5.1)
Sodium: 135 mmol/L (ref 135–145)
Total Bilirubin: 0.9 mg/dL (ref 0.3–1.2)
Total Protein: 7.3 g/dL (ref 6.5–8.1)

## 2022-07-30 LAB — HEMOGLOBIN A1C
Hgb A1c MFr Bld: 6.2 % — ABNORMAL HIGH (ref 4.8–5.6)
Mean Plasma Glucose: 131.24 mg/dL

## 2022-07-30 MED ORDER — LOVASTATIN 20 MG PO TABS: 20.0000 mg | ORAL_TABLET | Freq: Every day | ORAL | 1 refills | Status: DC

## 2022-07-30 MED ORDER — ALLOPURINOL 100 MG PO TABS: 100.0000 mg | ORAL_TABLET | Freq: Every day | ORAL | 1 refills | Status: DC

## 2022-07-30 MED ORDER — OLMESARTAN MEDOXOMIL-HCTZ 40-25 MG PO TABS: 1.0000 | ORAL_TABLET | Freq: Every day | ORAL | 1 refills | Status: DC

## 2022-07-30 MED ORDER — LEVOTHYROXINE SODIUM 100 MCG PO TABS: 100.0000 ug | ORAL_TABLET | Freq: Every day | ORAL | 1 refills | Status: DC

## 2022-07-30 NOTE — Addendum Note (Signed)
Addended by: Everitt Amber on: 07/30/2022 10:05 AM   Modules accepted: Orders, Level of Service

## 2022-07-30 NOTE — Progress Notes (Signed)
Date:  07/30/2022   Name:  [name]   DOB:  1945/12/01   MRN:  [id number]   Chief Complaint: Hypertension, Gout, Hypothyroidism, Hyperlipidemia, and pneum vaccine  Hypertension This is a chronic problem. The current episode started more than 1 month ago. The problem has been gradually improving since onset. The problem is controlled. Pertinent negatives include no anxiety, blurred vision, chest pain, headaches, neck pain, orthopnea, palpitations, peripheral edema, PND or shortness of breath. There are no associated agents to hypertension. There are no known risk factors for coronary artery disease. Past treatments include angiotensin blockers and beta blockers. The current treatment provides moderate improvement. There are no compliance problems.  There is no history of angina, CAD/MI or CVA. Identifiable causes of hypertension include a thyroid problem. There is no history of chronic renal disease, a hypertension causing med or renovascular disease.  Hyperlipidemia This is a chronic problem. The current episode started more than 1 year ago. The problem is controlled. Recent lipid tests were reviewed and are normal. He has no history of chronic renal disease. Pertinent negatives include no chest pain, focal sensory loss, focal weakness, myalgias or shortness of breath. Current antihyperlipidemic treatment includes statins. The current treatment provides moderate improvement of lipids. There are no compliance problems.   Thyroid Problem Presents for follow-up visit. Patient reports no anxiety, cold intolerance, constipation, depressed mood, diaphoresis, diarrhea, dry skin, fatigue, hair loss, heat intolerance, nail problem, palpitations, visual change, weight gain or weight loss. The symptoms have been stable. His past medical history is significant for hyperlipidemia.    Lab Results  Component Value Date   NA 138 01/26/2022   K 4.8 01/26/2022   CO2 21 01/26/2022   GLUCOSE 113 (H)  01/26/2022   BUN 42 (H) 01/26/2022   CREATININE 2.10 (H) 01/26/2022   CALCIUM 9.4 01/26/2022   EGFR 32 (L) 01/26/2022   GFRNONAA 32 (L) 01/26/2020   Lab Results  Component Value Date   CHOL 113 01/26/2022   HDL 37 (L) 01/26/2022   LDLCALC 56 01/26/2022   TRIG 108 01/26/2022   CHOLHDL 4.0 07/03/2017   Lab Results  Component Value Date   TSH 1.740 07/27/2021   Lab Results  Component Value Date   HGBA1C 6.5 (H) 01/26/2022   Lab Results  Component Value Date   WBC 7.6 07/24/2018   HGB 14.0 07/24/2018   HCT 43.7 07/24/2018   MCV 85 07/24/2018   PLT 212 07/24/2018   Lab Results  Component Value Date   ALT 12 07/27/2020   AST 17 07/27/2020   ALKPHOS 74 07/27/2020   BILITOT 0.7 07/27/2020   No results found for: "25OHVITD2", "25OHVITD3", "VD25OH"   Review of Systems  Constitutional:  Negative for diaphoresis, fatigue, unexpected weight change, weight gain and weight loss.  HENT:  Negative for trouble swallowing.   Eyes:  Negative for blurred vision and visual disturbance.  Respiratory:  Negative for shortness of breath and wheezing.   Cardiovascular:  Negative for chest pain, palpitations, orthopnea, leg swelling and PND.  Gastrointestinal:  Negative for blood in stool, constipation and diarrhea.  Endocrine: Negative for cold intolerance, heat intolerance, polydipsia and polyuria.  Genitourinary:  Negative for difficulty urinating.  Musculoskeletal:  Negative for myalgias and neck pain.  Neurological:  Negative for focal weakness, weakness, numbness and headaches.  Hematological:  Negative for adenopathy. Does not bruise/bleed easily.  Psychiatric/Behavioral:  The patient is not nervous/anxious.     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 bypass   CARDIOVERSION N/A 06/14/2021   Procedure: CARDIOVERSION;  Surgeon: Lamar Blinks, MD;  Location: [facility] ORS;  Service: Cardiovascular;  Laterality: N/A;   peptic ulcer     removed   TONSILLECTOMY      Social History   Tobacco Use   Smoking status: Never   Smokeless tobacco: Current    Types: Chew  Vaping Use   Vaping Use: Never used  Substance Use Topics   Alcohol use: Yes    Alcohol/week: 0.0 standard drinks of alcohol   Drug use: Never     Medication list has been reviewed and updated.  Current Meds  Medication Sig   allopurinol (ZYLOPRIM) 100 MG tablet Take 1 tablet (100 mg total) by mouth daily.   aspirin EC 81 MG tablet Take 81 mg by mouth every evening. Swallow whole.   diltiazem (CARDIZEM CD) 120 MG 24 hr capsule Take 120 mg by mouth in the morning.   levothyroxine (SYNTHROID) 100 MCG tablet Take 1 tablet (100 mcg total) by mouth daily.   lovastatin (MEVACOR) 20 MG tablet Take 1 tablet (20 mg total) by mouth daily.   metoprolol succinate (TOPROL-XL) 50 MG 24 hr tablet Take 1 tablet by mouth daily.   olmesartan-hydrochlorothiazide (BENICAR HCT) 40-25 MG tablet Take 1 tablet by mouth daily.       07/30/2022    9:07 AM 01/26/2022    9:02 AM 07/27/2021    7:55 AM 02/03/2021   11:00 AM  GAD 7 : Generalized Anxiety Score  Nervous,  Anxious, on Edge 0 0 0 0  Control/stop worrying 0 0 0 0  Worry too much - different things 0 0 0 0  Trouble relaxing 0 0 0 0  Restless 0 0 0 0  Easily annoyed or irritable 0 0 0 0  Afraid - awful might happen 0 0 0 0  Total GAD 7 Score 0 0 0 0  Anxiety Difficulty Not difficult at all Not difficult at all Not difficult at all Not difficult at all       07/30/2022    9:07 AM 01/26/2022    9:02 AM 07/27/2021    7:54 AM  Depression screen PHQ 2/9  Decreased Interest 0 0 0  Down, Depressed, Hopeless 0 0 0  PHQ - 2 Score 0 0 0  Altered sleeping 0 0 0  Tired, decreased energy 0 0 0  Change in appetite 0 0 0  Feeling bad or failure about yourself  0 0 0  Trouble concentrating 0 0 0  Moving slowly or fidgety/restless 0 0 0  Suicidal thoughts 0 0 0  PHQ-9 Score 0 0  0  Difficult doing work/chores Not difficult at all Not difficult at all Not difficult at all    BP Readings from Last 3 Encounters:  07/30/22 128/78  01/26/22 128/78  08/28/21 120/70    Physical Exam Vitals and nursing note reviewed.  HENT:     Head: Normocephalic.     Right Ear: Tympanic membrane and external ear normal.     Left Ear: Tympanic membrane and external ear normal.     Nose: Nose normal.     Mouth/Throat:     Mouth: Mucous membranes are moist.  Eyes:     General: No scleral icterus.       Right eye: No discharge.        Left eye: No discharge.     Conjunctiva/sclera: Conjunctivae normal.     Pupils: Pupils are equal, round, and reactive to light.  Neck:     Thyroid: No thyromegaly.     Vascular: No JVD.     Trachea: No tracheal deviation.  Cardiovascular:     Rate and Rhythm: Normal rate and regular rhythm.     Heart sounds: Normal heart sounds, S1 normal and S2 normal. No murmur heard.    No systolic murmur is present.     No diastolic murmur is present.     No friction rub. No gallop. No S3 or S4 sounds.  Pulmonary:     Effort: No respiratory distress.     Breath sounds: Normal breath  sounds. No wheezing or rales.  Abdominal:     General: Bowel sounds are normal.     Palpations: Abdomen is soft. There is no mass.     Tenderness: There is no abdominal tenderness. There is no guarding or rebound.  Musculoskeletal:        General: No tenderness. Normal range of motion.     Cervical back: Normal range of motion and neck supple.  Lymphadenopathy:     Cervical: No cervical adenopathy.  Skin:    General: Skin is warm.     Findings: No rash.  Neurological:     Mental Status: He is alert.     Wt Readings from Last 3 Encounters:  07/30/22 193 lb (87.5 kg)  01/26/22 194 lb (88 kg)  08/28/21 190 lb (86.2 kg)    BP 128/78 (BP Location: Right Arm, Cuff Size: Normal)   Pulse (!) 117   Ht 6\' 1"  (1.854 m)   Wt 193 lb (87.5 kg)   SpO2 97%   BMI 25.46 kg/m   Assessment and Plan:  1. Essential hypertension Chronic.  Controlled.  Stable.  Blood pressure 128/78.  Asymptomatic.  Tolerating medication well.  Continue olmesartan 40-25 1 tablet daily and metoprolol - olmesartan-hydrochlorothiazide (BENICAR HCT) 40-25 MG tablet; Take 1 tablet by mouth daily.  Dispense: 90 tablet; Refill: 1 - Comprehensive Metabolic Panel (CMET)  2. Chronic gout without tophus, unspecified cause, unspecified site Chronic.  Controlled.  Stable.  No acute episodes noted over the past 6 months.  Continue allopurinol 100 mg daily for suppression. - allopurinol (ZYLOPRIM) 100 MG tablet; Take 1 tablet (100 mg total) by mouth daily.  Dispense: 90 tablet; Refill: 1  3. Paroxysmal atrial fibrillation ([facility]) Chronic.  Controlled.  Stable.  Followed by cardiology.  Continue continue rate suppression with beta-blocker and calcium channel blocker. - levothyroxine (SYNTHROID) 100 MCG tablet; Take 1 tablet (100 mcg total) by mouth daily.  Dispense: 90 tablet; Refill: 1  4. Hypothyroidism, unspecified type .  Controlled.  Stable.  Pending TSH we will likely continue levothyroxine 100 mcg daily. -  levothyroxine (SYNTHROID) 100 MCG tablet; Take 1 tablet (100 mcg total) by mouth daily.  Dispense: 90 tablet; Refill: 1 - TSH  5. Familial multiple lipoprotein-type hyperlipidemia Chronic.  Controlled.  Stable.  Continue lovastatin 20 mg once a day. - lovastatin (MEVACOR) 20 MG tablet; Take 1 tablet (20 mg total) by mouth daily.  Dispense: 90 tablet; Refill: 1  6. Prediabetes Chronic.  Controlled.  Stable.  Currently diet controlled.  Will continue pending A1c measurement. - HgB A1c    Elizabeth Sauer, MD

## 2022-07-31 LAB — TSH: TSH: 1.82 u[IU]/mL (ref 0.350–4.500)

## 2022-08-01 NOTE — Progress Notes (Signed)
PC to pt discussed labs, voiced understanding.

## 2022-08-20 DIAGNOSIS — I4892 Unspecified atrial flutter: Secondary | ICD-10-CM | POA: Diagnosis not present

## 2022-08-20 DIAGNOSIS — I10 Essential (primary) hypertension: Secondary | ICD-10-CM | POA: Diagnosis not present

## 2022-08-20 DIAGNOSIS — E782 Mixed hyperlipidemia: Secondary | ICD-10-CM | POA: Diagnosis not present

## 2022-08-20 DIAGNOSIS — N1832 Chronic kidney disease, stage 3b: Secondary | ICD-10-CM | POA: Diagnosis not present

## 2022-08-20 DIAGNOSIS — I25810 Atherosclerosis of coronary artery bypass graft(s) without angina pectoris: Secondary | ICD-10-CM | POA: Diagnosis not present

## 2022-08-20 DIAGNOSIS — I480 Paroxysmal atrial fibrillation: Secondary | ICD-10-CM | POA: Diagnosis not present

## 2022-08-20 DIAGNOSIS — I38 Endocarditis, valve unspecified: Secondary | ICD-10-CM | POA: Diagnosis not present

## 2022-08-20 DIAGNOSIS — R001 Bradycardia, unspecified: Secondary | ICD-10-CM | POA: Diagnosis not present

## 2022-08-24 DIAGNOSIS — H43813 Vitreous degeneration, bilateral: Secondary | ICD-10-CM | POA: Diagnosis not present

## 2022-08-24 DIAGNOSIS — H2513 Age-related nuclear cataract, bilateral: Secondary | ICD-10-CM | POA: Diagnosis not present

## 2022-09-05 DIAGNOSIS — N2581 Secondary hyperparathyroidism of renal origin: Secondary | ICD-10-CM | POA: Diagnosis not present

## 2022-09-05 DIAGNOSIS — I129 Hypertensive chronic kidney disease with stage 1 through stage 4 chronic kidney disease, or unspecified chronic kidney disease: Secondary | ICD-10-CM | POA: Diagnosis not present

## 2022-09-05 DIAGNOSIS — R809 Proteinuria, unspecified: Secondary | ICD-10-CM | POA: Diagnosis not present

## 2022-09-05 DIAGNOSIS — D631 Anemia in chronic kidney disease: Secondary | ICD-10-CM | POA: Diagnosis not present

## 2022-09-05 DIAGNOSIS — N1832 Chronic kidney disease, stage 3b: Secondary | ICD-10-CM | POA: Diagnosis not present

## 2022-09-11 DIAGNOSIS — I129 Hypertensive chronic kidney disease with stage 1 through stage 4 chronic kidney disease, or unspecified chronic kidney disease: Secondary | ICD-10-CM | POA: Diagnosis not present

## 2022-09-11 DIAGNOSIS — R809 Proteinuria, unspecified: Secondary | ICD-10-CM | POA: Diagnosis not present

## 2022-09-11 DIAGNOSIS — N2581 Secondary hyperparathyroidism of renal origin: Secondary | ICD-10-CM | POA: Diagnosis not present

## 2022-09-11 DIAGNOSIS — D631 Anemia in chronic kidney disease: Secondary | ICD-10-CM | POA: Diagnosis not present

## 2022-09-11 DIAGNOSIS — N184 Chronic kidney disease, stage 4 (severe): Secondary | ICD-10-CM | POA: Diagnosis not present

## 2022-11-20 DIAGNOSIS — D3709 Neoplasm of uncertain behavior of other specified sites of the oral cavity: Secondary | ICD-10-CM | POA: Diagnosis not present

## 2022-11-20 DIAGNOSIS — C44329 Squamous cell carcinoma of skin of other parts of face: Secondary | ICD-10-CM | POA: Diagnosis not present

## 2022-11-27 ENCOUNTER — Other Ambulatory Visit: Payer: Self-pay | Admitting: Otolaryngology

## 2022-11-27 DIAGNOSIS — C060 Malignant neoplasm of cheek mucosa: Secondary | ICD-10-CM

## 2022-12-05 ENCOUNTER — Other Ambulatory Visit: Payer: Self-pay | Admitting: Otolaryngology

## 2022-12-05 ENCOUNTER — Ambulatory Visit
Admission: RE | Admit: 2022-12-05 | Discharge: 2022-12-05 | Disposition: A | Discharge status: Home / Self Care | Payer: Medicare Other | Source: Ambulatory Visit | Attending: Otolaryngology | Admitting: Otolaryngology

## 2022-12-05 DIAGNOSIS — C060 Malignant neoplasm of cheek mucosa: Secondary | ICD-10-CM | POA: Diagnosis not present

## 2022-12-05 DIAGNOSIS — I700 Atherosclerosis of aorta: Secondary | ICD-10-CM | POA: Insufficient documentation

## 2022-12-05 DIAGNOSIS — N2889 Other specified disorders of kidney and ureter: Secondary | ICD-10-CM | POA: Insufficient documentation

## 2022-12-05 DIAGNOSIS — I6523 Occlusion and stenosis of bilateral carotid arteries: Secondary | ICD-10-CM | POA: Diagnosis not present

## 2022-12-05 DIAGNOSIS — M47812 Spondylosis without myelopathy or radiculopathy, cervical region: Secondary | ICD-10-CM | POA: Diagnosis not present

## 2022-12-05 LAB — POCT I-STAT CREATININE: Creatinine, Ser: 2.6 mg/dL — ABNORMAL HIGH (ref 0.61–1.24)

## 2023-01-24 ENCOUNTER — Other Ambulatory Visit: Payer: Self-pay | Admitting: Family Medicine

## 2023-01-24 DIAGNOSIS — I10 Essential (primary) hypertension: Secondary | ICD-10-CM

## 2023-01-25 NOTE — Telephone Encounter (Signed)
Requested Prescriptions  Pending Prescriptions Disp Refills   olmesartan-hydrochlorothiazide (BENICAR HCT) 40-25 MG tablet [Pharmacy Med Name: OLMESARTAN-HCTZ 40-25 MG TAB] 90 tablet 0    Sig: TAKE 1 TABLET BY MOUTH EVERY DAY     Cardiovascular: ARB + Diuretic Combos Failed - 01/24/2023 12:56 AM      Failed - Cr in normal range and within 180 days    Creatinine  Date Value Ref Range Status  02/21/2013 2.12 (H) 0.60 - 1.30 mg/dL Final   Creatinine, Ser  Date Value Ref Range Status  12/05/2022 2.60 (H) 0.61 - 1.24 mg/dL Final         Passed - K in normal range and within 180 days    Potassium  Date Value Ref Range Status  07/30/2022 5.0 3.5 - 5.1 mmol/L Final  02/21/2013 3.8 3.5 - 5.1 mmol/L Final         Passed - Na in normal range and within 180 days    Sodium  Date Value Ref Range Status  07/30/2022 135 135 - 145 mmol/L Final  01/26/2022 138 134 - 144 mmol/L Final  02/21/2013 131 (L) 136 - 145 mmol/L Final         Passed - eGFR is 10 or above and within 180 days    EGFR (African American)  Date Value Ref Range Status  02/21/2013 36 (L)  Final   GFR calc Af Amer  Date Value Ref Range Status  01/26/2020 37 (L) >59 mL/min/1.73 Final    Comment:    **In accordance with recommendations from the NKF-ASN Task force,**   [facility] is in the process of updating its eGFR calculation to the   2021 CKD-EPI creatinine equation that estimates kidney function   without a race variable.    EGFR (Non-African Amer.)  Date Value Ref Range Status  02/21/2013 31 (L)  Final    Comment:    eGFR values <31mL/min/1.73 m2 may be an indication of chronic kidney disease (CKD). Calculated eGFR is useful in patients with stable renal function. The eGFR calculation will not be reliable in acutely ill patients when serum creatinine is changing rapidly. It is not useful in  patients on dialysis. The eGFR calculation may not be applicable to patients at the low and high extremes of body sizes,  pregnant women, and vegetarians.    GFR, Estimated  Date Value Ref Range Status  07/30/2022 27 (L) >60 mL/min Final    Comment:    (NOTE) Calculated using the CKD-EPI Creatinine Equation (2021)    eGFR  Date Value Ref Range Status  01/26/2022 32 (L) >59 mL/min/1.73 Final         Passed - Patient is not pregnant      Passed - Last BP in normal range    BP Readings from Last 1 Encounters:  07/30/22 128/78         Passed - Valid encounter within last 6 months    Recent Outpatient Visits           5 months ago Essential hypertension   [facility]   12 months ago Essential hypertension   [facility]   1 year ago Acute maxillary sinusitis, recurrence not specified   [facility]   1 year ago Essential hypertension   [facility]   1 year ago Median nerve dysfunction, right   [facility] at [facility], Ocie Bob, MD       Future Appointments             In 4 days Duanne Limerick, MD [facility] at [facility], [facility]

## 2023-01-29 ENCOUNTER — Encounter: Payer: Self-pay | Admitting: Family Medicine

## 2023-01-29 ENCOUNTER — Ambulatory Visit (INDEPENDENT_AMBULATORY_CARE_PROVIDER_SITE_OTHER): Payer: Medicare Other | Admitting: Family Medicine

## 2023-01-29 VITALS — BP 130/70 | HR 91 | Ht 73.0 in | Wt 198.0 lb

## 2023-01-29 DIAGNOSIS — M1A9XX0 Chronic gout, unspecified, without tophus (tophi): Secondary | ICD-10-CM

## 2023-01-29 DIAGNOSIS — L309 Dermatitis, unspecified: Secondary | ICD-10-CM | POA: Diagnosis not present

## 2023-01-29 DIAGNOSIS — I480 Paroxysmal atrial fibrillation: Secondary | ICD-10-CM | POA: Diagnosis not present

## 2023-01-29 DIAGNOSIS — I10 Essential (primary) hypertension: Secondary | ICD-10-CM | POA: Diagnosis not present

## 2023-01-29 DIAGNOSIS — E039 Hypothyroidism, unspecified: Secondary | ICD-10-CM

## 2023-01-29 DIAGNOSIS — E7849 Other hyperlipidemia: Secondary | ICD-10-CM | POA: Diagnosis not present

## 2023-01-29 MED ORDER — LOVASTATIN 20 MG PO TABS
20.0000 mg | ORAL_TABLET | Freq: Every day | ORAL | 1 refills | Status: DC
Start: 2023-01-29 — End: 2023-08-13

## 2023-01-29 MED ORDER — ALLOPURINOL 100 MG PO TABS
100.0000 mg | ORAL_TABLET | Freq: Every day | ORAL | 1 refills | Status: DC
Start: 2023-01-29 — End: 2023-08-13

## 2023-01-29 MED ORDER — TRIAMCINOLONE ACETONIDE 0.1 % EX CREA
1.0000 | TOPICAL_CREAM | Freq: Two times a day (BID) | CUTANEOUS | 1 refills | Status: DC
Start: 2023-01-29 — End: 2023-10-15

## 2023-01-29 MED ORDER — OLMESARTAN MEDOXOMIL-HCTZ 40-25 MG PO TABS
1.0000 | ORAL_TABLET | Freq: Every day | ORAL | 1 refills | Status: DC
Start: 2023-01-29 — End: 2023-08-13

## 2023-01-29 MED ORDER — LEVOTHYROXINE SODIUM 100 MCG PO TABS
100.0000 ug | ORAL_TABLET | Freq: Every day | ORAL | 1 refills | Status: DC
Start: 2023-01-29 — End: 2023-08-13

## 2023-01-29 NOTE — Progress Notes (Signed)
Date:  01/29/2023   Name:  [name]   DOB:  [birth year]   MRN:  [id number]   Chief Complaint: Hypothyroidism, Hypertension, Hyperlipidemia, and Gout  Hypertension This is a chronic problem. The current episode started more than 1 year ago. The problem has been gradually improving since onset. The problem is controlled. Pertinent negatives include no anxiety, blurred vision, chest pain, headaches, malaise/fatigue, neck pain, orthopnea, palpitations, peripheral edema, PND, shortness of breath or sweats. There are no associated agents to hypertension. There are no known risk factors for coronary artery disease. Past treatments include angiotensin blockers, diuretics, beta blockers and alpha 1 blockers. The current treatment provides moderate improvement. There are no compliance problems.  There is no history of angina, kidney disease, CAD/MI, CVA, heart failure, left ventricular hypertrophy, PVD or retinopathy. Identifiable causes of hypertension include a thyroid problem. There is no history of chronic renal disease, a hypertension causing med or renovascular disease.  Hyperlipidemia This is a chronic problem. The current episode started more than 1 year ago. The problem is controlled. Recent lipid tests were reviewed and are normal. He has no history of chronic renal disease, diabetes, hypothyroidism, liver disease, obesity or nephrotic syndrome. Pertinent negatives include no chest pain, myalgias or shortness of breath. Current antihyperlipidemic treatment includes statins.  Thyroid Problem Presents for follow-up visit. Patient reports no anxiety, cold intolerance, constipation, depressed mood, diaphoresis, diarrhea, dry skin, fatigue, hoarse voice, palpitations, visual change or weight gain. His past medical history is significant for hyperlipidemia. There is no history of diabetes or heart failure.    Lab Results  Component Value Date   NA 135 07/30/2022   K 5.0 07/30/2022   CO2 23  07/30/2022   GLUCOSE 101 (H) 07/30/2022   BUN 50 (H) 07/30/2022   CREATININE 2.60 (H) 12/05/2022   CALCIUM 9.4 07/30/2022   EGFR 32 (L) 01/26/2022   GFRNONAA 27 (L) 07/30/2022   Lab Results  Component Value Date   CHOL 113 01/26/2022   HDL 37 (L) 01/26/2022   LDLCALC 56 01/26/2022   TRIG 108 01/26/2022   CHOLHDL 4.0 07/03/2017   Lab Results  Component Value Date   TSH 1.820 07/30/2022   Lab Results  Component Value Date   HGBA1C 6.2 (H) 07/30/2022   Lab Results  Component Value Date   WBC 7.6 07/24/2018   HGB 14.0 07/24/2018   HCT 43.7 07/24/2018   MCV 85 07/24/2018   PLT 212 07/24/2018   Lab Results  Component Value Date   ALT 14 07/30/2022   AST 22 07/30/2022   ALKPHOS 82 07/30/2022   BILITOT 0.9 07/30/2022   No results found for: "25OHVITD2", "25OHVITD3", "VD25OH"   Review of Systems  Constitutional:  Negative for chills, diaphoresis, fatigue, fever, malaise/fatigue and weight gain.  HENT:  Negative for drooling, ear discharge, ear pain, hoarse voice and sore throat.   Eyes:  Negative for blurred vision.  Respiratory:  Negative for cough, shortness of breath and wheezing.   Cardiovascular:  Negative for chest pain, palpitations, orthopnea, leg swelling and PND.  Gastrointestinal:  Negative for abdominal pain, blood in stool, constipation, diarrhea and nausea.  Endocrine: Negative for cold intolerance and polydipsia.  Genitourinary:  Negative for dysuria, frequency, hematuria and urgency.  Musculoskeletal:  Negative for back pain, myalgias and neck pain.  Skin:  Negative for rash.  Allergic/Immunologic: Negative for environmental allergies.  Neurological:  Negative for dizziness and headaches.  Hematological:  Does not bruise/bleed easily.  Psychiatric/Behavioral:  Negative for suicidal ideas. The patient is not nervous/anxious.     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 bypass   CARDIOVERSION N/A 06/14/2021   Procedure: CARDIOVERSION;  Surgeon: Lamar Blinks, MD;  Location: [facility] ORS;  Service: Cardiovascular;  Laterality: N/A;   peptic ulcer     removed   TONSILLECTOMY      Social History   Tobacco Use   Smoking status: Never   Smokeless tobacco: Current    Types: Chew  Vaping Use   Vaping status: Never Used  Substance Use Topics   Alcohol use: Yes    Alcohol/week: 0.0 standard drinks of alcohol   Drug use: Never     Medication list has been reviewed and updated.  Current Meds  Medication Sig   allopurinol (ZYLOPRIM) 100 MG tablet Take 1 tablet (100 mg total) by mouth daily.   aspirin EC 81 MG tablet Take 81 mg by mouth every evening. Swallow whole.   diltiazem (CARDIZEM CD) 120 MG 24 hr capsule Take 120 mg by mouth in the morning.   levothyroxine (SYNTHROID) 100 MCG tablet Take 1 tablet (100 mcg total) by mouth daily.   lovastatin (MEVACOR) 20 MG tablet Take 1 tablet (20 mg total) by mouth daily.   metoprolol succinate (TOPROL-XL) 50 MG 24 hr tablet Take 1 tablet by mouth daily. 1.5 tablets (75MG )    olmesartan-hydrochlorothiazide (BENICAR HCT) 40-25 MG tablet TAKE 1 TABLET BY MOUTH EVERY DAY       01/29/2023    9:13 AM 07/30/2022    9:07 AM 01/26/2022    9:02 AM 07/27/2021    7:55 AM  GAD 7 : Generalized Anxiety Score  Nervous, Anxious, on Edge 0 0 0 0  Control/stop worrying 0 0 0 0  Worry too much - different things 0 0 0 0  Trouble relaxing 0 0 0 0  Restless 0 0 0 0  Easily annoyed or irritable 0 0 0 0  Afraid - awful might happen 0 0 0 0  Total GAD 7 Score 0 0 0 0  Anxiety Difficulty Not difficult at all Not difficult at all Not difficult at all Not difficult at all       01/29/2023    9:13 AM 07/30/2022    9:07 AM 01/26/2022    9:02 AM  Depression screen PHQ 2/9  Decreased Interest 0 0 0  Down, Depressed, Hopeless 0 0 0  PHQ - 2 Score 0 0 0  Altered sleeping 0 0 0  Tired, decreased energy 0 0 0  Change in appetite  0 0 0  Feeling bad or failure about yourself  0 0 0  Trouble concentrating 0 0 0  Moving slowly or fidgety/restless 0 0 0  Suicidal thoughts 0 0 0  PHQ-9 Score 0 0 0  Difficult doing work/chores Not difficult at all Not difficult at all Not difficult at all    BP Readings from Last 3 Encounters:  01/29/23 130/70  07/30/22 128/78  01/26/22 128/78    Physical Exam Vitals and nursing note reviewed.  HENT:     Head: Normocephalic.     Right Ear: Tympanic membrane and external ear normal.     Left Ear: Tympanic membrane and external ear normal.     Nose: Nose normal. No congestion or rhinorrhea.     Mouth/Throat:     Mouth: Mucous membranes are moist.  Eyes:     General: No scleral icterus.       Right eye: No discharge.        Left eye: No discharge.     Conjunctiva/sclera: Conjunctivae normal.     Pupils: Pupils are equal, round, and reactive to light.  Neck:     Thyroid: No thyromegaly.     Vascular: No JVD.     Trachea: No tracheal deviation.  Cardiovascular:     Rate and Rhythm: Normal rate and regular rhythm.     Heart sounds: Normal  heart sounds. No murmur heard.    No friction rub. No gallop.  Pulmonary:     Effort: No respiratory distress.     Breath sounds: Normal breath sounds. No wheezing, rhonchi or rales.  Chest:     Chest wall: No tenderness.  Abdominal:     General: Bowel sounds are normal.     Palpations: Abdomen is soft. There is no mass.     Tenderness: There is no abdominal tenderness. There is no guarding or rebound.  Musculoskeletal:        General: No tenderness. Normal range of motion.     Cervical back: Normal range of motion and neck supple.  Lymphadenopathy:     Cervical: No cervical adenopathy.  Skin:    General: Skin is warm.     Capillary Refill: Capillary refill takes less than 2 seconds.     Findings: No rash.  Neurological:     Mental Status: He is alert and oriented to person, place, and time.     Cranial Nerves: No cranial nerve deficit.     Deep Tendon Reflexes: Reflexes are normal and symmetric.     Wt Readings from Last 3 Encounters:  01/29/23 198 lb (89.8 kg)  07/30/22 193 lb (87.5 kg)  01/26/22 194 lb (88 kg)    BP 130/70   Pulse 91   Ht 6\' 1"  (1.854 m)   Wt 198 lb (89.8 kg)   SpO2 95%   BMI 26.12 kg/m   Assessment and Plan: 1. Essential hypertension Chronic.  Controlled.  Stable.  Blood pressure 130/70.  Asymptomatic.  Tolerating medications well.  Currently on olmesartan-hydrochlorothiazide 40-25 once a day.  Will check renal function panel for electrolytes and GFR.  Will recheck patient in 6 months. - olmesartan-hydrochlorothiazide (BENICAR HCT) 40-25 MG tablet; Take 1 tablet by mouth daily.  Dispense: 90 tablet; Refill: 1 - Renal Function Panel  2. Familial multiple lipoprotein-type hyperlipidemia Chronic.  Controlled.  Stable.  Continue lovastatin 20 mg once a day patient is tolerating statin without incident including no myalgias or muscle weakness.  Will continue at current dosing  and check lipid panel for current level of control.. - lovastatin (MEVACOR)  20 MG tablet; Take 1 tablet (20 mg total) by mouth daily.  Dispense: 90 tablet; Refill: 1 - Lipid Panel With LDL/HDL Ratio  3. Paroxysmal atrial fibrillation (HCC) As noted below - levothyroxine (SYNTHROID) 100 MCG tablet; Take 1 tablet (100 mcg total) by mouth daily.  Dispense: 90 tablet; Refill: 1  4. Hypothyroidism, unspecified type Chronic.  Controlled.  Stable.  Patient is currently followed for low thyroid and is currently supplemented with 100 mcg daily.  Will check thyroid panel with TSH for current level of control.  Will recheck patient in 6 months. - levothyroxine (SYNTHROID) 100 MCG tablet; Take 1 tablet (100 mcg total) by mouth daily.  Dispense: 90 tablet; Refill: 1 - Thyroid Panel With TSH  5. Chronic gout without tophus, unspecified cause, unspecified site Chronic.  Controlled.  No incidence of gout and since we have started allopurinol 100 mg daily. - allopurinol (ZYLOPRIM) 100 MG tablet; Take 1 tablet (100 mg total) by mouth daily.  Dispense: 90 tablet; Refill: 1  6. Dermatitis Patient has history of dermatitis for which he uses triamcinolone cream 0.1% on an as-needed basis. - triamcinolone cream (KENALOG) 0.1 %; Apply 1 Application topically 2 (two) times daily.  Dispense: 30 g; Refill: 1     Elizabeth Sauer, MD

## 2023-01-30 ENCOUNTER — Encounter: Payer: Self-pay | Admitting: Family Medicine

## 2023-01-30 LAB — LIPID PANEL WITH LDL/HDL RATIO
Cholesterol, Total: 125 mg/dL (ref 100–199)
HDL: 31 mg/dL — ABNORMAL LOW (ref >39)
LDL Chol Calc (NIH): 69 mg/dL (ref 0–99)
LDL/HDL Ratio: 2.2 {ratio} (ref 0.0–3.6)
Triglycerides: 138 mg/dL (ref 0–149)
VLDL Cholesterol Cal: 25 mg/dL (ref 5–40)

## 2023-01-30 LAB — RENAL FUNCTION PANEL
Albumin: 4.2 g/dL (ref 3.8–4.8)
BUN/Creatinine Ratio: 16 (ref 10–24)
BUN: 40 mg/dL — ABNORMAL HIGH (ref 8–27)
CO2: 23 mmol/L (ref 20–29)
Calcium: 9.4 mg/dL (ref 8.6–10.2)
Chloride: 104 mmol/L (ref 96–106)
Creatinine, Ser: 2.48 mg/dL — ABNORMAL HIGH (ref 0.76–1.27)
Glucose: 95 mg/dL (ref 70–99)
Phosphorus: 3.4 mg/dL (ref 2.8–4.1)
Potassium: 5.1 mmol/L (ref 3.5–5.2)
Sodium: 140 mmol/L (ref 134–144)
eGFR: 26 mL/min/{1.73_m2} — ABNORMAL LOW (ref >59)

## 2023-01-30 LAB — THYROID PANEL WITH TSH
Free Thyroxine Index: 2.9 (ref 1.2–4.9)
T3 Uptake Ratio: 31 % (ref 24–39)
T4, Total: 9.3 ug/dL (ref 4.5–12.0)
TSH: 2.99 u[IU]/mL (ref 0.450–4.500)

## 2023-02-01 DIAGNOSIS — C148 Malignant neoplasm of overlapping sites of lip, oral cavity and pharynx: Secondary | ICD-10-CM | POA: Diagnosis not present

## 2023-02-01 DIAGNOSIS — C060 Malignant neoplasm of cheek mucosa: Secondary | ICD-10-CM | POA: Diagnosis not present

## 2023-02-11 DIAGNOSIS — C44320 Squamous cell carcinoma of skin of unspecified parts of face: Secondary | ICD-10-CM | POA: Diagnosis not present

## 2023-02-11 DIAGNOSIS — C44329 Squamous cell carcinoma of skin of other parts of face: Secondary | ICD-10-CM | POA: Diagnosis not present

## 2023-02-15 DIAGNOSIS — C148 Malignant neoplasm of overlapping sites of lip, oral cavity and pharynx: Secondary | ICD-10-CM | POA: Diagnosis not present

## 2023-02-25 DIAGNOSIS — E782 Mixed hyperlipidemia: Secondary | ICD-10-CM | POA: Diagnosis not present

## 2023-02-25 DIAGNOSIS — I10 Essential (primary) hypertension: Secondary | ICD-10-CM | POA: Diagnosis not present

## 2023-02-25 DIAGNOSIS — I4892 Unspecified atrial flutter: Secondary | ICD-10-CM | POA: Diagnosis not present

## 2023-02-25 DIAGNOSIS — I25810 Atherosclerosis of coronary artery bypass graft(s) without angina pectoris: Secondary | ICD-10-CM | POA: Diagnosis not present

## 2023-02-25 DIAGNOSIS — I480 Paroxysmal atrial fibrillation: Secondary | ICD-10-CM | POA: Diagnosis not present

## 2023-02-25 DIAGNOSIS — I38 Endocarditis, valve unspecified: Secondary | ICD-10-CM | POA: Diagnosis not present

## 2023-02-25 DIAGNOSIS — N184 Chronic kidney disease, stage 4 (severe): Secondary | ICD-10-CM | POA: Diagnosis not present

## 2023-03-08 DIAGNOSIS — R9431 Abnormal electrocardiogram [ECG] [EKG]: Secondary | ICD-10-CM | POA: Diagnosis not present

## 2023-03-08 DIAGNOSIS — E039 Hypothyroidism, unspecified: Secondary | ICD-10-CM | POA: Diagnosis not present

## 2023-03-08 DIAGNOSIS — N1832 Chronic kidney disease, stage 3b: Secondary | ICD-10-CM | POA: Diagnosis not present

## 2023-03-08 DIAGNOSIS — C060 Malignant neoplasm of cheek mucosa: Secondary | ICD-10-CM | POA: Diagnosis not present

## 2023-03-08 DIAGNOSIS — Z01818 Encounter for other preprocedural examination: Secondary | ICD-10-CM | POA: Diagnosis not present

## 2023-03-08 DIAGNOSIS — I4892 Unspecified atrial flutter: Secondary | ICD-10-CM | POA: Diagnosis not present

## 2023-03-08 DIAGNOSIS — Z01810 Encounter for preprocedural cardiovascular examination: Secondary | ICD-10-CM | POA: Diagnosis not present

## 2023-03-08 DIAGNOSIS — I25810 Atherosclerosis of coronary artery bypass graft(s) without angina pectoris: Secondary | ICD-10-CM | POA: Diagnosis not present

## 2023-03-08 DIAGNOSIS — I4891 Unspecified atrial fibrillation: Secondary | ICD-10-CM | POA: Diagnosis not present

## 2023-03-08 DIAGNOSIS — I452 Bifascicular block: Secondary | ICD-10-CM | POA: Diagnosis not present

## 2023-03-08 DIAGNOSIS — F17290 Nicotine dependence, other tobacco product, uncomplicated: Secondary | ICD-10-CM | POA: Diagnosis not present

## 2023-03-08 DIAGNOSIS — I480 Paroxysmal atrial fibrillation: Secondary | ICD-10-CM | POA: Diagnosis not present

## 2023-03-08 DIAGNOSIS — I129 Hypertensive chronic kidney disease with stage 1 through stage 4 chronic kidney disease, or unspecified chronic kidney disease: Secondary | ICD-10-CM | POA: Diagnosis not present

## 2023-03-18 DIAGNOSIS — N2581 Secondary hyperparathyroidism of renal origin: Secondary | ICD-10-CM | POA: Diagnosis not present

## 2023-03-18 DIAGNOSIS — N184 Chronic kidney disease, stage 4 (severe): Secondary | ICD-10-CM | POA: Diagnosis not present

## 2023-03-18 DIAGNOSIS — D631 Anemia in chronic kidney disease: Secondary | ICD-10-CM | POA: Diagnosis not present

## 2023-03-18 DIAGNOSIS — R809 Proteinuria, unspecified: Secondary | ICD-10-CM | POA: Diagnosis not present

## 2023-03-18 DIAGNOSIS — I129 Hypertensive chronic kidney disease with stage 1 through stage 4 chronic kidney disease, or unspecified chronic kidney disease: Secondary | ICD-10-CM | POA: Diagnosis not present

## 2023-03-26 DIAGNOSIS — I2510 Atherosclerotic heart disease of native coronary artery without angina pectoris: Secondary | ICD-10-CM | POA: Diagnosis not present

## 2023-03-26 DIAGNOSIS — I5022 Chronic systolic (congestive) heart failure: Secondary | ICD-10-CM | POA: Diagnosis not present

## 2023-03-26 DIAGNOSIS — I130 Hypertensive heart and chronic kidney disease with heart failure and stage 1 through stage 4 chronic kidney disease, or unspecified chronic kidney disease: Secondary | ICD-10-CM | POA: Diagnosis not present

## 2023-03-26 DIAGNOSIS — C060 Malignant neoplasm of cheek mucosa: Secondary | ICD-10-CM | POA: Diagnosis not present

## 2023-03-26 DIAGNOSIS — Z951 Presence of aortocoronary bypass graft: Secondary | ICD-10-CM | POA: Diagnosis not present

## 2023-03-26 DIAGNOSIS — I480 Paroxysmal atrial fibrillation: Secondary | ICD-10-CM | POA: Diagnosis not present

## 2023-03-26 DIAGNOSIS — N189 Chronic kidney disease, unspecified: Secondary | ICD-10-CM | POA: Diagnosis not present

## 2023-03-26 DIAGNOSIS — Z7982 Long term (current) use of aspirin: Secondary | ICD-10-CM | POA: Diagnosis not present

## 2023-03-26 DIAGNOSIS — Z79899 Other long term (current) drug therapy: Secondary | ICD-10-CM | POA: Diagnosis not present

## 2023-03-26 DIAGNOSIS — I252 Old myocardial infarction: Secondary | ICD-10-CM | POA: Diagnosis not present

## 2023-03-26 DIAGNOSIS — Z8711 Personal history of peptic ulcer disease: Secondary | ICD-10-CM | POA: Diagnosis not present

## 2023-03-26 DIAGNOSIS — F17220 Nicotine dependence, chewing tobacco, uncomplicated: Secondary | ICD-10-CM | POA: Diagnosis not present

## 2023-03-29 ENCOUNTER — Telehealth: Payer: Self-pay

## 2023-03-29 NOTE — Transitions of Care (Post Inpatient/ED Visit) (Signed)
   03/29/2023  Name: [name] MRN: [id number] DOB: May 05, 1945  Today's TOC FU Call Status: Today's TOC FU Call Status:: Unsuccessful Call (1st Attempt) Unsuccessful Call (1st Attempt) Date: 03/29/23  Attempted to reach the patient regarding the most recent Inpatient/ED visit. Patient was called in an Outreach attempt to offer VBCI  30-day TOC program. Pt is eligible for program due to potential risk for readmission and/or high utilization. Unfortunately, I was not able to speak with the patient in regards to recent hospital discharge     Patient's voicemail has  not been set up-unable to leave a message   Follow Up Plan: Additional outreach attempts will be made to reach the patient to complete the Transitions of Care (Post Inpatient/ED visit) call.   Bari Mayans , BSN, RN [facility]   VBCI-Population Health RN Care Manager Direct Dial [phone]  Fax: [phone] Website: [email or web address]

## 2023-04-02 ENCOUNTER — Telehealth: Payer: Self-pay

## 2023-04-02 NOTE — Transitions of Care (Post Inpatient/ED Visit) (Signed)
04/02/2023  Name: [name] MRN: [id number] DOB: 03-27-1945  Today's TOC FU Call Status: Today's TOC FU Call Status:: Successful TOC FU Call Completed Unsuccessful Call (1st Attempt) Date: 03/29/23 [facility] FU Call Complete Date: 04/02/23 Patient's Name and Date of Birth confirmed.  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 How have you been since you were released from the hospital?: Better Any questions or concerns?: No  Items Reviewed: Did you receive and understand the discharge instructions provided?: Yes Medications obtained,verified, and reconciled?: Yes (Medications Reviewed) Any new allergies since your discharge?: No Dietary orders reviewed?: Yes Type of Diet Ordered:: Reg Soft foods x 2 weeks Do you have support at home?: Yes People in Home: spouse Name of Support/Comfort Primary Source: Adora  Medications Reviewed Today: Medications Reviewed Today     Reviewed by Johnnette Barrios, RN (Registered Nurse) on 04/02/23 at [phone]  Med List Status: <None>   Medication Order Taking? Sig Documenting Provider Last Dose Status Informant  allopurinol (ZYLOPRIM) 100 MG tablet [id number] Yes Take 1 tablet (100 mg total) by mouth daily. Duanne Limerick, MD Taking Active   aspirin EC 81 MG tablet [id number] Yes Take 81 mg by mouth every evening. Swallow whole. [provider] Taking Active Self           Med Note (Itzy Adler L   Tue Apr 02, 2023  9:51 AM) Resumed   diltiazem (CARDIZEM CD) 120 MG 24 hr capsule [id number] Yes Take 120 mg by mouth in the morning. [provider] Taking Active Self  levothyroxine (SYNTHROID) 100 MCG tablet [id number] Yes Take 1 tablet (100 mcg total) by mouth daily. Duanne Limerick, MD Taking Active   lovastatin (MEVACOR) 20 MG tablet [id number] Yes Take 1 tablet (20 mg total) by mouth daily. Duanne Limerick, MD Taking Active   metoprolol succinate (TOPROL-XL) 50 MG 24 hr tablet [id number] Yes Take 1 tablet by mouth daily. 1.5 tablets (75MG ) [provider] Taking Active            Med Note (Davion Meara L   Tue Apr 02, 2023  9:50 AM) Taking 50 mg and a 25 mg -= 75   olmesartan-hydrochlorothiazide (BENICAR HCT) 40-25 MG tablet [id number] Yes Take 1 tablet by mouth daily. Duanne Limerick, MD Taking Active   triamcinolone cream (KENALOG) 0.1 % [id number] No Apply 1 Application topically 2 (two) times daily.  Patient not taking: Reported on 04/02/2023   Duanne Limerick, MD Not Taking Active             Home Care and Equipment/Supplies: Were Home Health Services Ordered?: No Any new equipment or medical supplies ordered?: No  Functional Questionnaire: Do you need assistance with bathing/showering or dressing?: No Do you need assistance with meal preparation?: No Do you need assistance with eating?: No Do you have difficulty maintaining continence: No Do you need assistance with getting out of bed/getting out of a chair/moving?: No Do you have difficulty managing or taking your medications?: No  Follow up appointments reviewed: PCP Follow-up appointment confirmed?: No (Deanna Yetta Barre is retiring amd he was insteucted to fllow up with alternativeprovider , He is looking for alternative PCP) MD Provider Line Number:[phone] Given: No Specialist Hospital Follow-up appointment confirmed?: Yes Date of Specialist follow-up appointment?: 04/03/23 Follow-Up Specialty Provider::  ENT Do you need transportation to your follow-up appointment?: No (Spouse drives , he is able to) Do you understand care options if your condition(s) worsen?: Yes-patient verbalized understanding  SDOH Interventions Today    Flowsheet Row Most Recent Value  SDOH Interventions    Food Insecurity Interventions Intervention Not Indicated  Housing Interventions Intervention Not Indicated  Transportation Interventions Intervention Not Indicated, Patient Resources (Friends/Family)  Utilities Interventions Intervention Not Indicated      Interventions Today    Flowsheet Row Most Recent Value  Chronic Disease   Chronic disease during today's visit Other  [recent surgery]  General Interventions   General Interventions Discussed/Reviewed General Interventions Discussed, General Interventions Reviewed, Doctor Visits  Doctor Visits Discussed/Reviewed PCP, Specialist  PCP/Specialist Visits Compliance with follow-up visit  Exercise Interventions   Exercise Discussed/Reviewed Exercise Reviewed, Physical Activity  Nutrition Interventions   Nutrition Discussed/Reviewed Nutrition Reviewed, Fluid intake  Pharmacy Interventions   Pharmacy Dicussed/Reviewed Medications and their functions  Safety Interventions   Safety Discussed/Reviewed Safety Reviewed       Benefits reviewed  Based on current information and Insurance plan -Reviewed benefits accessible to patient, including details about eligibility options for care and  available value based care options  if any areas of needs were identified.  Reviewed patient/  caregiver's ability to access and / or  ability with navigating the benefits system..Amb Referral made if indicted , refer to orders section of note for details   Reviewed goals for care Patient/ Caregiver  verbalizes understanding of instructions and care plan provided. Patient / Caregiver was encouraged to make informed decisions about their care, actively participate in managing their health condition, and implement lifestyle changes as needed to promote independence and self-management of health care. There were no reported  barriers to care.   TOC program  Patient is at high risk for readmission and/or has history of  high utilization  Discussed VBCI  TOC  program and weekly calls to patient to assess condition/status, medication management  and provide support/education as indicated . Patient/ Caregiver voiced understanding and declined enrollment in the 30-day TOC Program.    He states he is doing fine His incision is C/D/I He is cleaning it with mix H2O2 and H2). His drain has scant amt drainage , He has follow-up with ENT 2/12 He did not schedule with Elizabeth Sauer He was told she is retiring and he needed to find alternative provider he is in the process. He feels it is a bother when someone calls and feels he has this under control and plans to be out on his motorcycle soon.    The patient has been provided with contact information for the care management team and has been advised to call with any health-related questions or concerns. Follow up as indicated with Care Team , or sooner should any new problems arise.     Susa Loffler , BSN, RN [facility]   VBCI-Population Health RN Care Manager Direct Dial [phone]  Fax: [phone] Website: [email or web address]

## 2023-04-03 DIAGNOSIS — C060 Malignant neoplasm of cheek mucosa: Secondary | ICD-10-CM | POA: Diagnosis not present

## 2023-04-03 DIAGNOSIS — Z4889 Encounter for other specified surgical aftercare: Secondary | ICD-10-CM | POA: Diagnosis not present

## 2023-05-29 DIAGNOSIS — E782 Mixed hyperlipidemia: Secondary | ICD-10-CM | POA: Diagnosis not present

## 2023-05-29 DIAGNOSIS — I5020 Unspecified systolic (congestive) heart failure: Secondary | ICD-10-CM | POA: Diagnosis not present

## 2023-05-29 DIAGNOSIS — I38 Endocarditis, valve unspecified: Secondary | ICD-10-CM | POA: Diagnosis not present

## 2023-05-29 DIAGNOSIS — I10 Essential (primary) hypertension: Secondary | ICD-10-CM | POA: Diagnosis not present

## 2023-05-29 DIAGNOSIS — I25810 Atherosclerosis of coronary artery bypass graft(s) without angina pectoris: Secondary | ICD-10-CM | POA: Diagnosis not present

## 2023-05-29 DIAGNOSIS — I4892 Unspecified atrial flutter: Secondary | ICD-10-CM | POA: Diagnosis not present

## 2023-07-03 DIAGNOSIS — F17290 Nicotine dependence, other tobacco product, uncomplicated: Secondary | ICD-10-CM | POA: Diagnosis not present

## 2023-07-03 DIAGNOSIS — N1832 Chronic kidney disease, stage 3b: Secondary | ICD-10-CM | POA: Diagnosis not present

## 2023-07-03 DIAGNOSIS — M1A9XX0 Chronic gout, unspecified, without tophus (tophi): Secondary | ICD-10-CM | POA: Diagnosis not present

## 2023-07-03 DIAGNOSIS — I5020 Unspecified systolic (congestive) heart failure: Secondary | ICD-10-CM | POA: Diagnosis not present

## 2023-07-03 DIAGNOSIS — I480 Paroxysmal atrial fibrillation: Secondary | ICD-10-CM | POA: Diagnosis not present

## 2023-07-03 DIAGNOSIS — I130 Hypertensive heart and chronic kidney disease with heart failure and stage 1 through stage 4 chronic kidney disease, or unspecified chronic kidney disease: Secondary | ICD-10-CM | POA: Diagnosis not present

## 2023-07-03 DIAGNOSIS — E039 Hypothyroidism, unspecified: Secondary | ICD-10-CM | POA: Diagnosis not present

## 2023-07-03 DIAGNOSIS — I25810 Atherosclerosis of coronary artery bypass graft(s) without angina pectoris: Secondary | ICD-10-CM | POA: Diagnosis not present

## 2023-07-18 ENCOUNTER — Ambulatory Visit: Payer: Self-pay | Admitting: Family Medicine

## 2023-08-13 ENCOUNTER — Other Ambulatory Visit: Payer: Self-pay

## 2023-08-13 DIAGNOSIS — M1A9XX0 Chronic gout, unspecified, without tophus (tophi): Secondary | ICD-10-CM

## 2023-08-13 DIAGNOSIS — E7849 Other hyperlipidemia: Secondary | ICD-10-CM

## 2023-08-13 DIAGNOSIS — I480 Paroxysmal atrial fibrillation: Secondary | ICD-10-CM

## 2023-08-13 DIAGNOSIS — I10 Essential (primary) hypertension: Secondary | ICD-10-CM

## 2023-08-13 DIAGNOSIS — E039 Hypothyroidism, unspecified: Secondary | ICD-10-CM

## 2023-08-13 MED ORDER — ALLOPURINOL 100 MG PO TABS: 100.0000 mg | ORAL_TABLET | Freq: Every day | ORAL | 1 refills | Status: AC

## 2023-08-13 MED ORDER — LEVOTHYROXINE SODIUM 100 MCG PO TABS: 100.0000 ug | ORAL_TABLET | Freq: Every day | ORAL | 1 refills | Status: AC

## 2023-08-13 MED ORDER — OLMESARTAN MEDOXOMIL-HCTZ 40-25 MG PO TABS: 1.0000 | ORAL_TABLET | Freq: Every day | ORAL | 1 refills | Status: AC

## 2023-08-13 MED ORDER — LOVASTATIN 20 MG PO TABS: 20.0000 mg | ORAL_TABLET | Freq: Every day | ORAL | 1 refills | Status: AC

## 2023-10-15 ENCOUNTER — Other Ambulatory Visit

## 2023-10-15 ENCOUNTER — Inpatient Hospital Stay: Attending: Internal Medicine | Admitting: Internal Medicine

## 2023-10-15 ENCOUNTER — Encounter: Payer: Self-pay | Admitting: Internal Medicine

## 2023-10-15 VITALS — BP 146/81 | HR 81 | Temp 98.3°F | Resp 20 | Ht 73.0 in | Wt 186.5 lb

## 2023-10-15 DIAGNOSIS — D649 Anemia, unspecified: Secondary | ICD-10-CM | POA: Diagnosis present

## 2023-10-15 DIAGNOSIS — N189 Chronic kidney disease, unspecified: Secondary | ICD-10-CM | POA: Insufficient documentation

## 2023-10-15 NOTE — Progress Notes (Signed)
 Patient has no concerns

## 2023-10-15 NOTE — Assessment & Plan Note (Addendum)
#   Chronic anemia-normocytic recently getting worse.  Patient is quite symptomatic from anemia [cold/fatigue] . The etiology is likely chronic renal disease. AUG 2025- [nephrology]-hemoglobin 9.5; iron  saturation 7 ferritin 7. EGD- 2009- Colonoscopy- >> EGD- not keen to repeat endoscopies.   I had a long discussion with patient regarding multiple other etiologies of anemia-including nutritional; malabsorption; primary bone marrow disorders etc. will hold off any workup today-will order labs for next visit including myeloma panel LDH; erythropoietin.  # Discussed at length the pathophysiology of anemia from chronic kidney disease which includes decreased erythropoietin production; and decreased iron  stores in the body.  I discussed that I would recommend using iron  infusion/Venofer ; along with Retacrit to maintain hemoglobin around 10.  I would recommend the goal hemoglobin less than 10 as there is a increased risk of thromboembolic events in the target hemoglobin is around 12 to 13.     I discussed the potential acute infusion reactions with IV iron ; which are quite rare.  Patient understands the risk; will proceed with infusions.  Also discussed the role of Retacrit boosting the hemoglobin.    # Poorly controlled Blood pressures-  will repeat BP again today.  Discussed compliance with medications.  And also follow-up with nephrology closely.  Thank you Dr. Dennise  for allowing me to participate in the care of your pleasant patient. Please do not hesitate to contact me with questions or concerns in the interim.  # DISPOSITION: # NO labs today-  # weekly venofer  x 3- if possible this week # follow up appx- 5 weeks-- MD; labs- cbc/bmp; b12; LDH; MM panel.k/l light chains; possible venofer  or retacrit-- Dr.B

## 2023-10-15 NOTE — Progress Notes (Signed)
 [facility] CONSULT NOTE  Patient Care Team: Salli Amato, MD as PCP - General (Internal Medicine) Rennie Cindy SAUNDERS, MD as Consulting Physician (Oncology)  CHIEF COMPLAINTS/PURPOSE OF CONSULTATION: ANEMIA   HEMATOLOGY HISTORY  # ANEMIA[Hb; MCV-platelets- WBC; Iron  sat; ferritin;  GFR- CT/US ; EGD/colonoscopy-  # CKD- [Dr.]  AUG 2025  Ferritin 8 Low    --  Iron , Total -- 25 Low     TIBC -- 360  Iron  Saturation (TSat) -- 7 Low       HISTORY OF PRESENTING ILLNESS: Patient ambulating-independently. Accompanied by family.   [name] 78 y.o.  male pleasant patient  with CKD  is been referred to us  for further evaluation of anemia.  Patient follows up with nephrology.  Noted to have labs show anemia has worsened to hemoglobin of 9.5 compared to 11.5 last year. His iron  levels came back extremely low.  He has been referred to hematology for further evaluation recommendations.  Blood in stools: none Blood in urine:none Difficulty swallowing:none Change of bowel movement/constipation:none Prior blood transfusion: Hx of PUD- in 20s.  Liver disease: none Chronic kidney disease:  Alcohol: rare Bariatric surgery: none Prior IV iron  infusions:none- patient is not on oral iron .    Review of Systems  Constitutional:  Positive for malaise/fatigue. Negative for chills, diaphoresis, fever and weight loss.  HENT:  Negative for nosebleeds and sore throat.   Eyes:  Negative for double vision.  Respiratory:  Negative for cough, hemoptysis, sputum production, shortness of breath and wheezing.   Cardiovascular:  Negative for chest pain, palpitations, orthopnea and leg swelling.  Gastrointestinal:  Negative for abdominal pain, blood in stool, constipation, diarrhea, heartburn, melena, nausea and vomiting.  Genitourinary:  Negative for dysuria, frequency and urgency.  Musculoskeletal:  Negative for back pain and joint pain.  Skin: Negative.  Negative for itching and  rash.  Neurological:  Negative for dizziness, tingling, focal weakness, weakness and headaches.  Endo/Heme/Allergies:  Does not bruise/bleed easily.  Psychiatric/Behavioral:  Negative for depression. The patient is not nervous/anxious and does not have insomnia.      MEDICAL HISTORY:  Past Medical History:  Diagnosis Date   Gout    Hyperlipidemia    Hypertension    Thyroid  disease     SURGICAL HISTORY: Past Surgical History:  Procedure Laterality Date   APPENDECTOMY     CARDIAC SURGERY     quad. bypass   CARDIOVERSION N/A 06/14/2021   Procedure: CARDIOVERSION;  Surgeon: Hester Wolm PARAS, MD;  Location: [facility] ORS;  Service: Cardiovascular;  Laterality: N/A;   peptic ulcer     removed   TONSILLECTOMY      SOCIAL HISTORY: Social History   Socioeconomic History   Marital status: Married    Spouse name: Adora Kohles   Number of children: Not on file   Years of education: Not on file   Highest education level: Not on file  Occupational History   Not on file  Tobacco Use   Smoking status: Never   Smokeless tobacco: Current    Types: Chew  Vaping Use   Vaping status: Never Used  Substance and Sexual Activity   Alcohol use: Yes    Alcohol/week: 0.0 standard drinks of alcohol   Drug use: Never   Sexual activity: Yes    Partners: Female    Birth control/protection: None  Other Topics Concern   Not on file  Social History Narrative   Not on file   Social Drivers of Health  Financial Resource Strain: Low Risk  (07/03/2023)   Received from [facility]   Overall Financial Resource Strain (CARDIA)    Difficulty of Paying Living Expenses: Not hard at all  Food Insecurity: No Food Insecurity (10/15/2023)   Hunger Vital Sign    Worried About Running Out of Food in the Last Year: Never true    Ran Out of Food in the Last Year: Never true  Transportation Needs: No Transportation Needs (10/15/2023)   PRAPARE - Administrator, Civil Service  (Medical): No    Lack of Transportation (Non-Medical): No  Physical Activity: Not on file  Stress: Not on file  Social Connections: Not on file  Intimate Partner Violence: Not At Risk (10/15/2023)   Humiliation, Afraid, Rape, and Kick questionnaire    Fear of Current or Ex-Partner: No    Emotionally Abused: No    Physically Abused: No    Sexually Abused: No    FAMILY HISTORY: Family History  Family history unknown: Yes    ALLERGIES:  is allergic to rivaroxaban.  MEDICATIONS:  Current Outpatient Medications  Medication Sig Dispense Refill   acetaminophen (TYLENOL) 325 MG tablet Take 650 mg by mouth every 4 (four) hours as needed.     allopurinol  (ZYLOPRIM ) 100 MG tablet Take 1 tablet (100 mg total) by mouth daily. 90 tablet 1   aspirin  EC 81 MG tablet Take 81 mg by mouth every evening. Swallow whole.     levothyroxine  (SYNTHROID ) 100 MCG tablet Take 1 tablet (100 mcg total) by mouth daily. 90 tablet 1   lovastatin  (MEVACOR ) 20 MG tablet Take 1 tablet (20 mg total) by mouth daily. 90 tablet 1   metoprolol  succinate (TOPROL -XL) 50 MG 24 hr tablet Take 1 tablet by mouth daily. 1.5 tablets (75MG )     olmesartan -hydrochlorothiazide (BENICAR  HCT) 40-25 MG tablet Take 1 tablet by mouth daily. 90 tablet 1   diltiazem  (CARDIZEM  CD) 120 MG 24 hr capsule Take 120 mg by mouth in the morning. (Patient not taking: Reported on 10/15/2023)     No current facility-administered medications for this visit.     SABRA  PHYSICAL EXAMINATION:   Vitals:   10/15/23 1053  BP: (!) 146/81  Pulse: 81  Resp: 20  Temp: 98.3 F (36.8 C)  SpO2: 100%   Filed Weights   10/15/23 1053  Weight: 186 lb 8 oz (84.6 kg)    Physical Exam Vitals and nursing note reviewed.  HENT:     Head: Normocephalic and atraumatic.     Mouth/Throat:     Pharynx: Oropharynx is clear.  Eyes:     Extraocular Movements: Extraocular movements intact.     Pupils: Pupils are equal, round, and reactive to light.   Cardiovascular:     Rate and Rhythm: Normal rate and regular rhythm.  Pulmonary:     Comments: Decreased breath sounds bilaterally.  Abdominal:     Palpations: Abdomen is soft.  Musculoskeletal:        General: Normal range of motion.     Cervical back: Normal range of motion.  Skin:    General: Skin is warm.  Neurological:     General: No focal deficit present.     Mental Status: He is alert and oriented to person, place, and time.  Psychiatric:        Behavior: Behavior normal.        Judgment: Judgment normal.      LABORATORY DATA:  I have reviewed the  data as listed Lab Results  Component Value Date   WBC 7.6 07/24/2018   HGB 14.0 07/24/2018   HCT 43.7 07/24/2018   MCV 85 07/24/2018   PLT 212 07/24/2018   Recent Labs    12/05/22 0912 01/29/23 1014  NA  --  140  K  --  5.1  CL  --  104  CO2  --  23  GLUCOSE  --  95  BUN  --  40*  CREATININE 2.60* 2.48*  CALCIUM  --  9.4  ALBUMIN  --  4.2     No results found.  ASSESSMENT & PLAN:   Symptomatic anemia # Chronic anemia-normocytic recently getting worse.  Patient is quite symptomatic from anemia . The etiology is likely chronic renal disease. Labs- 2024-   I had a long discussion with patient regarding multiple other etiologies of anemia-including nutritional; malabsorption; primary bone marrow disorders etc.   #I recommend CBC CMP LDH peripheral smear; haptoglobin; erythropoietin; iron  studies ferritin B12 folic acid; reticulocyte count; hepatitis panel; HIV; multiple myeloma panel.  Kappa lambda light chain ratio. HOLD of bone marrow Biopsy at this time; pending above work up. EGD- 2009- Colonoscopy- >> EGD-    # Discussed at length the pathophysiology of anemia from chronic kidney disease which includes decreased erythropoietin production; and decreased iron  stores in the body.  I discussed that I would recommend using iron  infusion/Venofer ; along with Retacrit to maintain hemoglobin around 10.  I would  recommend the goal hemoglobin less than 10 as there is a increased risk of thromboembolic events in the target hemoglobin is around 12 to 13.     I discussed the potential acute infusion reactions with IV iron ; which are quite rare.  Patient understands the risk; will proceed with infusions.  Also discussed the role of Retacrit boosting the hemoglobin.    # Poorly controlled Blood pressures-  will repeat BP again today.  Discussed compliance with medications.  And also follow-up with nephrology closely.  Thank you Dr. Dennise  for allowing me to participate in the care of your pleasant patient. Please do not hesitate to contact me with questions or concerns in the interim.  # DISPOSITION: # NO labs today-  # weekly venofer  x 3- if possible this week # follow up appx- 5 weeks-- MD; labs- cbc/bmp; b12; LDH; MM panel.k/l light chains; possible venofer  or retacrit-- Dr.B    All questions were answered. The patient knows to call the clinic with any problems, questions or concerns.    Cindy JONELLE Joe, MD 10/15/2023 12:03 PM

## 2023-10-16 ENCOUNTER — Inpatient Hospital Stay

## 2023-10-16 VITALS — BP 145/94 | HR 72 | Temp 97.5°F | Resp 18

## 2023-10-16 DIAGNOSIS — D649 Anemia, unspecified: Secondary | ICD-10-CM

## 2023-10-16 MED ORDER — IRON SUCROSE 20 MG/ML IV SOLN
200.0000 mg | Freq: Once | INTRAVENOUS | Status: AC
  Administered 2023-10-16: 200 mg via INTRAVENOUS
  Filled 2023-10-16: qty 10

## 2023-10-23 ENCOUNTER — Inpatient Hospital Stay: Attending: Internal Medicine

## 2023-10-23 VITALS — BP 158/96 | HR 79 | Temp 95.0°F | Resp 18

## 2023-10-23 DIAGNOSIS — N184 Chronic kidney disease, stage 4 (severe): Secondary | ICD-10-CM | POA: Diagnosis not present

## 2023-10-23 DIAGNOSIS — R5383 Other fatigue: Secondary | ICD-10-CM | POA: Insufficient documentation

## 2023-10-23 DIAGNOSIS — D649 Anemia, unspecified: Secondary | ICD-10-CM | POA: Diagnosis present

## 2023-10-23 DIAGNOSIS — F17220 Nicotine dependence, chewing tobacco, uncomplicated: Secondary | ICD-10-CM | POA: Diagnosis not present

## 2023-10-23 MED ORDER — IRON SUCROSE 20 MG/ML IV SOLN
200.0000 mg | Freq: Once | INTRAVENOUS | Status: AC
  Administered 2023-10-23: 200 mg via INTRAVENOUS
  Filled 2023-10-23: qty 10

## 2023-10-23 MED ORDER — SODIUM CHLORIDE 0.9% FLUSH
10.0000 mL | Freq: Once | INTRAVENOUS | Status: AC | PRN
  Administered 2023-10-23: 10 mL
  Filled 2023-10-23: qty 10

## 2023-10-28 ENCOUNTER — Inpatient Hospital Stay

## 2023-10-28 VITALS — BP 148/88 | HR 79 | Temp 95.0°F | Resp 19 | Wt 182.9 lb

## 2023-10-28 DIAGNOSIS — D649 Anemia, unspecified: Secondary | ICD-10-CM

## 2023-10-28 MED ORDER — SODIUM CHLORIDE 0.9% FLUSH
10.0000 mL | Freq: Once | INTRAVENOUS | Status: AC | PRN
  Administered 2023-10-28: 10 mL
  Filled 2023-10-28: qty 10

## 2023-10-28 MED ORDER — IRON SUCROSE 20 MG/ML IV SOLN
200.0000 mg | Freq: Once | INTRAVENOUS | Status: AC
  Administered 2023-10-28: 200 mg via INTRAVENOUS
  Filled 2023-10-28: qty 10

## 2023-11-19 ENCOUNTER — Inpatient Hospital Stay

## 2023-11-19 ENCOUNTER — Encounter: Payer: Self-pay | Admitting: Internal Medicine

## 2023-11-19 ENCOUNTER — Inpatient Hospital Stay: Admitting: Internal Medicine

## 2023-11-19 VITALS — BP 140/82 | HR 76 | Temp 97.0°F | Resp 20 | Ht 72.0 in | Wt 186.5 lb

## 2023-11-19 DIAGNOSIS — D649 Anemia, unspecified: Secondary | ICD-10-CM

## 2023-11-19 LAB — BASIC METABOLIC PANEL - CANCER CENTER ONLY
Anion gap: 9 (ref 5–15)
BUN: 55 mg/dL — ABNORMAL HIGH (ref 8–23)
CO2: 22 mmol/L (ref 22–32)
Calcium: 9.4 mg/dL (ref 8.9–10.3)
Chloride: 103 mmol/L (ref 98–111)
Creatinine: 2.61 mg/dL — ABNORMAL HIGH (ref 0.61–1.24)
GFR, Estimated: 24 mL/min — ABNORMAL LOW (ref >60)
Glucose, Bld: 119 mg/dL — ABNORMAL HIGH (ref 70–99)
Potassium: 4.1 mmol/L (ref 3.5–5.1)
Sodium: 134 mmol/L — ABNORMAL LOW (ref 135–145)

## 2023-11-19 LAB — CBC WITH DIFFERENTIAL (CANCER CENTER ONLY)
Abs Immature Granulocytes: 0.02 K/uL (ref 0.00–0.07)
Basophils Absolute: 0 K/uL (ref 0.0–0.1)
Basophils Relative: 1 %
Eosinophils Absolute: 0.3 K/uL (ref 0.0–0.5)
Eosinophils Relative: 4 %
HCT: 38.1 % — ABNORMAL LOW (ref 39.0–52.0)
Hemoglobin: 12.1 g/dL — ABNORMAL LOW (ref 13.0–17.0)
Immature Granulocytes: 0 %
Lymphocytes Relative: 11 %
Lymphs Abs: 0.7 K/uL (ref 0.7–4.0)
MCH: 26.9 pg (ref 26.0–34.0)
MCHC: 31.8 g/dL (ref 30.0–36.0)
MCV: 84.9 fL (ref 80.0–100.0)
Monocytes Absolute: 0.8 K/uL (ref 0.1–1.0)
Monocytes Relative: 12 %
Neutro Abs: 4.7 K/uL (ref 1.7–7.7)
Neutrophils Relative %: 72 %
Platelet Count: 155 K/uL (ref 150–400)
RBC: 4.49 MIL/uL (ref 4.22–5.81)
RDW: 21.1 % — ABNORMAL HIGH (ref 11.5–15.5)
WBC Count: 6.6 K/uL (ref 4.0–10.5)
nRBC: 0 % (ref 0.0–0.2)

## 2023-11-19 LAB — VITAMIN B12: Vitamin B-12: 305 pg/mL (ref 180–914)

## 2023-11-19 NOTE — Assessment & Plan Note (Signed)
#   Chronic anemia-normocytic recently getting worse.  Patient is quite symptomatic from anemia [cold/fatigue] . The etiology is likely chronic renal disease. AUG 2025- [nephrology]-hemoglobin 9.5; iron  saturation 7 ferritin 7. EGD- 2009- Colonoscopy- >> EGD- not keen to repeat endoscopies/declines. Awaiting on MM panel-  # s/p venofer - Improved at 12.3- #Recommend gentle iron  [iron  biglycinate; 28 mg ] 1 pill a day.  This pill is unlikely to cause stomach upset or cause constipation.     # Stage IV- CKD- [Dr.Singh]-   # On going fatigue- defer to PCP for further work up  # DISPOSITION: #HOLD  venofer  - Retacrit today # follow up in 4 months- MD; labs- cbc/bmp; b12; LDH-  possible venofer  or retacrit-- Dr.B

## 2023-11-19 NOTE — Progress Notes (Signed)
 [facility] CONSULT NOTE  Patient Care Team: Salli Amato, MD as PCP - General (Internal Medicine) Rennie Cory SAUNDERS, MD as Consulting Physician (Oncology)  CHIEF COMPLAINTS/PURPOSE OF CONSULTATION: ANEMIA   HEMATOLOGY HISTORY  # ANEMIA[Hb; MCV-platelets- WBC; Iron  sat; ferritin;  GFR- CT/US ; EGD/colonoscopy-  # CKD- [Dr.]  AUG 2025  Ferritin 8 Low    --  Iron , Total -- 25 Low     TIBC -- 360  Iron  Saturation (TSat) -- 7 Low       HISTORY OF PRESENTING ILLNESS: Patient ambulating-independently. Accompanied by family.   [name] 78 y.o.  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  Patient status post iron  infusion.  No reactions to iron  infusion.  However continues to complain of ongoing fatigue.  Denies any blood in stools black loose stools nausea vomiting.  Is currently not on iron  pills.   Review of Systems  Constitutional:  Positive for malaise/fatigue. Negative for chills, diaphoresis, fever and weight loss.  HENT:  Negative for nosebleeds and sore throat.   Eyes:  Negative for double vision.  Respiratory:  Negative for cough, hemoptysis, sputum production, shortness of breath and wheezing.   Cardiovascular:  Negative for chest pain, palpitations, orthopnea and leg swelling.  Gastrointestinal:  Negative for abdominal pain, blood in stool, constipation, diarrhea, heartburn, melena, nausea and vomiting.  Genitourinary:  Negative for dysuria, frequency and urgency.  Musculoskeletal:  Negative for back pain and joint pain.  Skin: Negative.  Negative for itching and rash.  Neurological:  Negative for dizziness, tingling, focal weakness, weakness and headaches.  Endo/Heme/Allergies:  Does not bruise/bleed easily.  Psychiatric/Behavioral:  Negative for depression. The patient is not nervous/anxious and does not have insomnia.      MEDICAL HISTORY:  Past Medical History:  Diagnosis Date   Gout     Hyperlipidemia    Hypertension    Thyroid  disease     SURGICAL HISTORY: Past Surgical History:  Procedure Laterality Date   APPENDECTOMY     CARDIAC SURGERY     quad. bypass   CARDIOVERSION N/A 06/14/2021   Procedure: CARDIOVERSION;  Surgeon: Hester Wolm PARAS, MD;  Location: [facility] ORS;  Service: Cardiovascular;  Laterality: N/A;   peptic ulcer     removed   TONSILLECTOMY      SOCIAL HISTORY: Social History   Socioeconomic History   Marital status: Married    Spouse name: Adora Sorce   Number of children: Not on file   Years of education: Not on file   Highest education level: Not on file  Occupational History   Not on file  Tobacco Use   Smoking status: Never   Smokeless tobacco: Current    Types: Chew  Vaping Use   Vaping status: Never Used  Substance and Sexual Activity   Alcohol use: Yes    Alcohol/week: 0.0 standard drinks of alcohol   Drug use: Never   Sexual activity: Yes    Partners: Female    Birth control/protection: None  Other Topics Concern   Not on file  Social History Narrative   Not on file   Social Drivers of Health   Financial Resource Strain: Low Risk  (07/03/2023)   Received from [facility]   Overall Financial Resource Strain (CARDIA)    Difficulty of Paying Living Expenses: Not hard at all  Food Insecurity: No Food Insecurity (10/15/2023)   Hunger Vital Sign    Worried About Running  Out of Food in the Last Year: Never true    Ran Out of Food in the Last Year: Never true  Transportation Needs: No Transportation Needs (10/15/2023)   PRAPARE - Administrator, Civil Service (Medical): No    Lack of Transportation (Non-Medical): No  Physical Activity: Not on file  Stress: Not on file  Social Connections: Not on file  Intimate Partner Violence: Not At Risk (10/15/2023)   Humiliation, Afraid, Rape, and Kick questionnaire    Fear of Current or Ex-Partner: No    Emotionally Abused: No    Physically Abused: No     Sexually Abused: No    FAMILY HISTORY: Family History  Family history unknown: Yes    ALLERGIES:  is allergic to rivaroxaban.  MEDICATIONS:  Current Outpatient Medications  Medication Sig Dispense Refill   acetaminophen (TYLENOL) 325 MG tablet Take 650 mg by mouth every 4 (four) hours as needed.     allopurinol  (ZYLOPRIM ) 100 MG tablet Take 1 tablet (100 mg total) by mouth daily. 90 tablet 1   aspirin  EC 81 MG tablet Take 81 mg by mouth every evening. Swallow whole.     levothyroxine  (SYNTHROID ) 100 MCG tablet Take 1 tablet (100 mcg total) by mouth daily. 90 tablet 1   lovastatin  (MEVACOR ) 20 MG tablet Take 1 tablet (20 mg total) by mouth daily. 90 tablet 1   metoprolol  succinate (TOPROL -XL) 50 MG 24 hr tablet Take 1 tablet by mouth daily. 1.5 tablets (75MG )     olmesartan -hydrochlorothiazide (BENICAR  HCT) 40-25 MG tablet Take 1 tablet by mouth daily. 90 tablet 1   No current facility-administered medications for this visit.     SABRA  PHYSICAL EXAMINATION:   Vitals:   11/19/23 1002  BP: (!) 140/82  Pulse: 76  Resp: 20  Temp: (!) 97 F (36.1 C)  SpO2: 99%   Filed Weights   11/19/23 1002  Weight: 186 lb 8 oz (84.6 kg)    Physical Exam Vitals and nursing note reviewed.  HENT:     Head: Normocephalic and atraumatic.     Mouth/Throat:     Pharynx: Oropharynx is clear.  Eyes:     Extraocular Movements: Extraocular movements intact.     Pupils: Pupils are equal, round, and reactive to light.  Cardiovascular:     Rate and Rhythm: Normal rate and regular rhythm.  Pulmonary:     Comments: Decreased breath sounds bilaterally.  Abdominal:     Palpations: Abdomen is soft.  Musculoskeletal:        General: Normal range of motion.     Cervical back: Normal range of motion.  Skin:    General: Skin is warm.  Neurological:     General: No focal deficit present.     Mental Status: He is alert and oriented to person, place, and time.  Psychiatric:        Behavior:  Behavior normal.        Judgment: Judgment normal.      LABORATORY DATA:  I have reviewed the data as listed Lab Results  Component Value Date   WBC 6.6 11/19/2023   HGB 12.1 (L) 11/19/2023   HCT 38.1 (L) 11/19/2023   MCV 84.9 11/19/2023   PLT 155 11/19/2023   Recent Labs    12/05/22 0912 01/29/23 1014 11/19/23 0946  NA  --  140 134*  K  --  5.1 4.1  CL  --  104 103  CO2  --  23 22  GLUCOSE  --  95 119*  BUN  --  40* 55*  CREATININE 2.60* 2.48* 2.61*  CALCIUM  --  9.4 9.4  GFRNONAA  --   --  24*  ALBUMIN  --  4.2  --      No results found.  ASSESSMENT & PLAN:   Symptomatic anemia # Chronic anemia-normocytic recently getting worse.  Patient is quite symptomatic from anemia [cold/fatigue] . The etiology is likely chronic renal disease. AUG 2025- [nephrology]-hemoglobin 9.5; iron  saturation 7 ferritin 7. EGD- 2009- Colonoscopy- >> EGD- not keen to repeat endoscopies/declines. Awaiting on MM panel-  # s/p venofer - Improved at 12.3- #Recommend gentle iron  [iron  biglycinate; 28 mg ] 1 pill a day.  This pill is unlikely to cause stomach upset or cause constipation.     # Stage IV- CKD- [Dr.Singh]-   # On going fatigue- defer to PCP for further work up  # DISPOSITION: #HOLD  venofer  - Retacrit today # follow up in 4 months- MD; labs- cbc/bmp; b12; LDH-  possible venofer  or retacrit-- Dr.B     All questions were answered. The patient knows to call the clinic with any problems, questions or concerns.    Cory JONELLE Joe, MD 11/19/2023 11:18 AM

## 2023-11-19 NOTE — Progress Notes (Signed)
Fatigue/weakness: yes Dyspena: no Light headedness: no Blood in stool: no  

## 2023-11-19 NOTE — Patient Instructions (Signed)
#  Recommend gentle iron  [iron  biglycinate; 28 mg ] 1 pill a day.  This pill is unlikely to cause stomach upset or cause constipation.  Available Over the counter or talk to pharmacist.

## 2023-11-20 LAB — KAPPA/LAMBDA LIGHT CHAINS
Kappa free light chain: 84 mg/L — ABNORMAL HIGH (ref 3.3–19.4)
Kappa, lambda light chain ratio: 1.95 — ABNORMAL HIGH (ref 0.26–1.65)
Lambda free light chains: 43 mg/L — ABNORMAL HIGH (ref 5.7–26.3)

## 2023-11-20 LAB — ERYTHROPOIETIN: Erythropoietin: 21.5 m[IU]/mL — ABNORMAL HIGH (ref 2.6–18.5)

## 2023-11-21 LAB — MULTIPLE MYELOMA PANEL, SERUM
Albumin SerPl Elph-Mcnc: 3.8 g/dL (ref 2.9–4.4)
Albumin/Glob SerPl: 1.3 (ref 0.7–1.7)
Alpha 1: 0.3 g/dL (ref 0.0–0.4)
Alpha2 Glob SerPl Elph-Mcnc: 0.8 g/dL (ref 0.4–1.0)
B-Globulin SerPl Elph-Mcnc: 1 g/dL (ref 0.7–1.3)
Gamma Glob SerPl Elph-Mcnc: 0.9 g/dL (ref 0.4–1.8)
Globulin, Total: 3 g/dL (ref 2.2–3.9)
IgA: 166 mg/dL (ref 61–437)
IgG (Immunoglobin G), Serum: 864 mg/dL (ref 603–1613)
IgM (Immunoglobulin M), Srm: 19 mg/dL (ref 15–143)
Total Protein ELP: 6.8 g/dL (ref 6.0–8.5)

## 2024-02-06 ENCOUNTER — Other Ambulatory Visit: Payer: Self-pay | Admitting: Family Medicine

## 2024-02-06 DIAGNOSIS — E7849 Other hyperlipidemia: Secondary | ICD-10-CM

## 2024-02-06 DIAGNOSIS — M1A9XX0 Chronic gout, unspecified, without tophus (tophi): Secondary | ICD-10-CM

## 2024-02-08 NOTE — Telephone Encounter (Signed)
 No longer under the care of providers at [facility], has new PCP, will refuse due to this.  Requested Prescriptions  Pending Prescriptions Disp Refills   allopurinol  (ZYLOPRIM ) 100 MG tablet [Pharmacy Med Name: ALLOPURINOL  100 MG TABLET] 90 tablet 1    Sig: TAKE 1 TABLET BY MOUTH EVERY DAY     Endocrinology:  Gout Agents - allopurinol  Failed - 02/08/2024  8:42 AM      Failed - Uric Acid in normal range and within 360 days    Uric Acid  Date Value Ref Range Status  07/27/2021 6.8 3.8 - 8.4 mg/dL Final    Comment:               Therapeutic target for gout patients: <6.0         Failed - Cr in normal range and within 360 days    Creatinine  Date Value Ref Range Status  11/19/2023 2.61 (H) 0.61 - 1.24 mg/dL Final  98/96/7984 7.87 (H) 0.60 - 1.30 mg/dL Final         Failed - Valid encounter within last 12 months    Recent Outpatient Visits   None            Failed - CBC within normal limits and completed in the last 12 months    WBC  Date Value Ref Range Status  07/02/2016 8.1 3.8 - 10.6 K/uL Final   WBC Count  Date Value Ref Range Status  11/19/2023 6.6 4.0 - 10.5 K/uL Final   RBC  Date Value Ref Range Status  11/19/2023 4.49 4.22 - 5.81 MIL/uL Final   Hemoglobin  Date Value Ref Range Status  11/19/2023 12.1 (L) 13.0 - 17.0 g/dL Final  93/95/7979 85.9 13.0 - 17.7 g/dL Final   HCT  Date Value Ref Range Status  11/19/2023 38.1 (L) 39.0 - 52.0 % Final   Hematocrit  Date Value Ref Range Status  07/24/2018 43.7 37.5 - 51.0 % Final   MCHC  Date Value Ref Range Status  11/19/2023 31.8 30.0 - 36.0 g/dL Final   [facility]  Date Value Ref Range Status  11/19/2023 26.9 26.0 - 34.0 pg Final   MCV  Date Value Ref Range Status  11/19/2023 84.9 80.0 - 100.0 fL Final  07/24/2018 85 79 - 97 fL Final  02/21/2013 91 80 - 100 fL Final   No results found for: PLTCOUNTKUC, LABPLAT, POCPLA RDW  Date Value Ref Range Status  11/19/2023 21.1 (H) 11.5 - 15.5 % Final   07/24/2018 13.9 11.6 - 15.4 % Final  02/21/2013 13.8 11.5 - 14.5 % Final          lovastatin  (MEVACOR ) 20 MG tablet [Pharmacy Med Name: LOVASTATIN  20 MG TABLET] 90 tablet 1    Sig: TAKE 1 TABLET BY MOUTH EVERY DAY     Cardiovascular:  Antilipid - Statins 2 Failed - 02/08/2024  8:42 AM      Failed - Cr in normal range and within 360 days    Creatinine  Date Value Ref Range Status  11/19/2023 2.61 (H) 0.61 - 1.24 mg/dL Final  98/96/7984 7.87 (H) 0.60 - 1.30 mg/dL Final         Failed - Valid encounter within last 12 months    Recent Outpatient Visits   None            Failed - Lipid Panel in normal range within the last 12 months    Cholesterol, Total  Date Value Ref Range Status  01/29/2023 125 100 - 199 mg/dL Final   Cholesterol  Date Value Ref Range Status  02/21/2013 107 0 - 200 mg/dL Final   Ldl Cholesterol, Calc  Date Value Ref Range Status  02/21/2013 57 0 - 100 mg/dL Final   LDL Chol Calc (NIH)  Date Value Ref Range Status  01/29/2023 69 0 - 99 mg/dL Final   HDL Cholesterol  Date Value Ref Range Status  02/21/2013 22 (L) 40 - 60 mg/dL Final   HDL  Date Value Ref Range Status  01/29/2023 31 (L) >39 mg/dL Final   Triglycerides  Date Value Ref Range Status  01/29/2023 138 0 - 149 mg/dL Final  98/96/7984 861 0 - 200 mg/dL Final         Passed - Patient is not pregnant

## 2024-03-14 ENCOUNTER — Other Ambulatory Visit: Payer: Self-pay | Admitting: Family Medicine

## 2024-03-14 DIAGNOSIS — I480 Paroxysmal atrial fibrillation: Secondary | ICD-10-CM

## 2024-03-14 DIAGNOSIS — E039 Hypothyroidism, unspecified: Secondary | ICD-10-CM

## 2024-03-16 NOTE — Telephone Encounter (Signed)
 Requested medication (s) are due for refill today: yes  Requested medication (s) are on the active medication list: yes  Last refill:  08/13/23  Future visit scheduled: no  Notes to clinic:  Unable to refill per protocol due to failed labs, no updated results.      Requested Prescriptions  Pending Prescriptions Disp Refills   levothyroxine  (SYNTHROID ) 100 MCG tablet [Pharmacy Med Name: LEVOTHYROXINE  100 MCG TABLET] 90 tablet 1    Sig: TAKE 1 TABLET BY MOUTH EVERY DAY     Endocrinology:  Hypothyroid Agents Failed - 03/16/2024  9:15 AM      Failed - TSH in normal range and within 360 days    TSH  Date Value Ref Range Status  01/29/2023 2.990 0.450 - 4.500 uIU/mL Final         Failed - Valid encounter within last 12 months    Recent Outpatient Visits   None

## 2024-03-20 ENCOUNTER — Telehealth: Payer: Self-pay | Admitting: Internal Medicine

## 2024-03-20 NOTE — Telephone Encounter (Signed)
 pt canceled appts on 2/3 and will wait until he sees his pcp/kidney doctor

## 2024-03-24 ENCOUNTER — Ambulatory Visit

## 2024-03-24 ENCOUNTER — Other Ambulatory Visit

## 2024-03-24 ENCOUNTER — Ambulatory Visit: Admitting: Internal Medicine

## 2024-03-25 ENCOUNTER — Other Ambulatory Visit: Payer: Self-pay | Admitting: Family Medicine

## 2024-03-25 DIAGNOSIS — I10 Essential (primary) hypertension: Secondary | ICD-10-CM

## 2024-03-26 NOTE — Telephone Encounter (Signed)
 Requested Prescriptions  Pending Prescriptions Disp Refills   olmesartan -hydrochlorothiazide (BENICAR  HCT) 40-25 MG tablet [Pharmacy Med Name: OLMESARTAN -HCTZ 40-25 MG TAB] 90 tablet 1    Sig: TAKE 1 TABLET BY MOUTH EVERY DAY     Cardiovascular: ARB + Diuretic Combos Failed - 03/26/2024  2:10 PM      Failed - Na in normal range and within 180 days    Sodium  Date Value Ref Range Status  11/19/2023 134 (L) 135 - 145 mmol/L Final  01/29/2023 140 134 - 144 mmol/L Final  02/21/2013 131 (L) 136 - 145 mmol/L Final         Failed - Cr in normal range and within 180 days    Creatinine  Date Value Ref Range Status  11/19/2023 2.61 (H) 0.61 - 1.24 mg/dL Final  98/96/7984 7.87 (H) 0.60 - 1.30 mg/dL Final         Failed - Last BP in normal range    BP Readings from Last 1 Encounters:  11/19/23 (!) 140/82         Failed - Valid encounter within last 6 months    Recent Outpatient Visits   None            Passed - K in normal range and within 180 days    Potassium  Date Value Ref Range Status  11/19/2023 4.1 3.5 - 5.1 mmol/L Final  02/21/2013 3.8 3.5 - 5.1 mmol/L Final         Passed - eGFR is 10 or above and within 180 days    EGFR (African American)  Date Value Ref Range Status  02/21/2013 36 (L)  Final   GFR calc Af Amer  Date Value Ref Range Status  01/26/2020 37 (L) >59 mL/min/1.73 Final    Comment:    **In accordance with recommendations from the NKF-ASN Task force,**   [facility] is in the process of updating its eGFR calculation to the   2021 CKD-EPI creatinine equation that estimates kidney function   without a race variable.    EGFR (Non-African Amer.)  Date Value Ref Range Status  02/21/2013 31 (L)  Final    Comment:    eGFR values <98mL/min/1.73 m2 may be an indication of chronic kidney disease (CKD). Calculated eGFR is useful in patients with stable renal function. The eGFR calculation will not be reliable in acutely ill patients when serum creatinine is  changing rapidly. It is not useful in  patients on dialysis. The eGFR calculation may not be applicable to patients at the low and high extremes of body sizes, pregnant women, and vegetarians.    GFR, Estimated  Date Value Ref Range Status  11/19/2023 24 (L) >60 mL/min Final    Comment:    (NOTE) Calculated using the CKD-EPI Creatinine Equation (2021)    eGFR  Date Value Ref Range Status  01/29/2023 26 (L) >59 mL/min/1.73 Final         Passed - Patient is not pregnant
